# Patient Record
Sex: Female | Born: 1990 | Race: Black or African American | Hispanic: No | Marital: Single | Smoking: Current some day smoker
Health system: Southern US, Community
[De-identification: ages and names within clinical notes are randomized; demographics above are authoritative.]

## PROBLEM LIST (undated history)

## (undated) DIAGNOSIS — F419 Anxiety disorder, unspecified: Secondary | ICD-10-CM

## (undated) HISTORY — PX: WISDOM TOOTH EXTRACTION: SHX21

## (undated) HISTORY — PX: DENTAL SURGERY: SHX609

---

## 2011-04-03 ENCOUNTER — Emergency Department (HOSPITAL_COMMUNITY)
Admission: EM | Admit: 2011-04-03 | Discharge: 2011-04-03 | Disposition: A | Discharge status: Home / Self Care | Payer: PRIVATE HEALTH INSURANCE | Attending: Emergency Medicine | Admitting: Emergency Medicine

## 2011-04-03 ENCOUNTER — Encounter: Payer: Self-pay | Admitting: Emergency Medicine

## 2011-04-03 DIAGNOSIS — A499 Bacterial infection, unspecified: Secondary | ICD-10-CM | POA: Insufficient documentation

## 2011-04-03 DIAGNOSIS — N760 Acute vaginitis: Secondary | ICD-10-CM | POA: Insufficient documentation

## 2011-04-03 DIAGNOSIS — B9689 Other specified bacterial agents as the cause of diseases classified elsewhere: Secondary | ICD-10-CM | POA: Insufficient documentation

## 2011-04-03 DIAGNOSIS — N949 Unspecified condition associated with female genital organs and menstrual cycle: Secondary | ICD-10-CM | POA: Insufficient documentation

## 2011-04-03 HISTORY — DX: Anxiety disorder, unspecified: F41.9

## 2011-04-03 LAB — URINALYSIS, ROUTINE W REFLEX MICROSCOPIC
Bilirubin Urine: NEGATIVE
Nitrite: NEGATIVE
Specific Gravity, Urine: 1.024 (ref 1.005–1.030)
Urobilinogen, UA: 1 mg/dL (ref 0.0–1.0)

## 2011-04-03 LAB — POCT PREGNANCY, URINE: Preg Test, Ur: NEGATIVE

## 2011-04-03 LAB — URINE MICROSCOPIC-ADD ON

## 2011-04-03 LAB — WET PREP, GENITAL

## 2011-04-03 MED ORDER — HYDROCODONE-ACETAMINOPHEN 5-325 MG PO TABS: 1.0000 | ORAL_TABLET | ORAL | Status: AC | PRN

## 2011-04-03 MED ORDER — METRONIDAZOLE 500 MG PO TABS: 500.0000 mg | ORAL_TABLET | Freq: Two times a day (BID) | ORAL | Status: AC

## 2011-04-03 NOTE — ED Notes (Signed)
Rx given x2 D/c instructions reviewed w/ pt and friend - pt and friend deny any further questions or concerns at present.  

## 2011-04-03 NOTE — ED Notes (Signed)
PT. REPORTS VAGINAL DISCHARGE ONSET LAST Saturday ,  PRESCRIBED WITH METRONIDAZOLE AND DIFLUCAN WITH NO RELIEF.

## 2011-04-03 NOTE — ED Provider Notes (Signed)
History     CSN: [id number] Arrival date & time: 04/03/2011 12:09 AM   First MD Initiated Contact with Patient 04/03/11 0057      Chief Complaint  Patient presents with  . Vaginal Discharge    (Consider location/radiation/quality/duration/timing/severity/associated sxs/prior treatment) The history is provided by the patient.   patient reports development of vaginal discharge approximately 5 days ago and was prescribed Diflucan as well as metronidazole cream without relief.  She reports ongoing discharge and vaginal discomfort.  She denies fever.  She denies abdominal pain.  She denies nausea and vomiting.  She denies chills.  She denies diarrhea.  No urinary frequency or urinary hesitancy or dysuria.. she reports the discomfort is moderate.  It is constant in nature.  Nothing improves her symptoms.  Nothing worsens her symptoms.  Past Medical History  Diagnosis Date  . Anxiety disorder     Past Surgical History  Procedure Date  . Wisdom tooth extraction     No family history on file.  History  Substance Use Topics  . Smoking status: Never Smoker   . Smokeless tobacco: Not on file  . Alcohol Use: Yes     OCCASIONAL    OB History    Grav Para Term Preterm Abortions TAB SAB Ect Mult Living                  Review of Systems  All other systems reviewed and are negative.    Allergies  Review of patient's allergies indicates no known allergies.  Home Medications   Current Outpatient Rx  Name Route Sig Dispense Refill  . FLUCONAZOLE 150 MG PO TABS Oral Take 150 mg by mouth once.      Marland Kitchen LORAZEPAM 0.5 MG PO TABS Oral Take 0.5 mg by mouth every 6 (six) hours as needed. For anxiety      . METRONIDAZOLE 0.75 % EX CREA Topical Apply 1 application topically 2 (two) times daily.      Marland Kitchen PROMETHAZINE-CODEINE 6.25-10 MG/5ML PO SYRP Oral Take 5 mLs by mouth every 4 (four) hours as needed. For cough and congestion       BP 112/62  Pulse 71  Temp(Src) 97.1 F (36.2 C)  (Oral)  Resp 16  SpO2 100%  LMP 03/09/2011  Physical Exam  Constitutional: She is oriented to person, place, and time. She appears well-developed and well-nourished.  HENT:  Head: Normocephalic.  Eyes: EOM are normal.  Neck: Normal range of motion.  Pulmonary/Chest: Effort normal.  Abdominal: Soft. She exhibits no distension. There is no tenderness.  Genitourinary:       Normal external genitalia.  Small amount of vaginal discharge was creamy in nature.  No cervical erosions or cervical motion tenderness.  No adnexal mass or tenderness.  No adnexal fullness.  No herpetic lesions noted.  Patient did have notable discomfort with insertion of the speculum.  Musculoskeletal: Normal range of motion.  Neurological: She is alert and oriented to person, place, and time.  Psychiatric: She has a normal mood and affect.    ED Course  Procedures (including critical care time)  Labs Reviewed  URINALYSIS, ROUTINE W REFLEX MICROSCOPIC - Abnormal; Notable for the following:    Appearance CLOUDY (*)    Hgb urine dipstick TRACE (*)    Leukocytes, UA LARGE (*)    All other components within normal limits  WET PREP, GENITAL - Abnormal; Notable for the following:    Clue Cells, Wet Prep FEW (*)    WBC,  Wet Prep HPF POC FEW (*)    All other components within normal limits  URINE MICROSCOPIC-ADD ON - Abnormal; Notable for the following:    Squamous Epithelial / LPF FEW (*)    Bacteria, UA MANY (*)    All other components within normal limits  POCT PREGNANCY, URINE  POCT PREGNANCY, URINE  GC/CHLAMYDIA PROBE AMP, GENITAL   No results found.   1. Bacterial vaginitis       MDM  Given complaints of vaginal discharge will cover with oral metronidazole. Dc home.   Suspect vaginitis.  No urinary symptoms.  Urine culture sent.     Lyanne Co, MD 04/03/11 [phone]

## 2011-04-04 LAB — GC/CHLAMYDIA PROBE AMP, GENITAL: GC Probe Amp, Genital: NEGATIVE

## 2012-08-22 ENCOUNTER — Encounter (HOSPITAL_COMMUNITY): Payer: Self-pay | Admitting: Family Medicine

## 2012-08-22 ENCOUNTER — Emergency Department (HOSPITAL_COMMUNITY): Payer: BC Managed Care – PPO

## 2012-08-22 ENCOUNTER — Emergency Department (HOSPITAL_COMMUNITY)
Admission: EM | Admit: 2012-08-22 | Discharge: 2012-08-23 | Disposition: A | Discharge status: Home / Self Care | Payer: BC Managed Care – PPO | Attending: Emergency Medicine | Admitting: Emergency Medicine

## 2012-08-22 DIAGNOSIS — Z79899 Other long term (current) drug therapy: Secondary | ICD-10-CM | POA: Insufficient documentation

## 2012-08-22 DIAGNOSIS — F17200 Nicotine dependence, unspecified, uncomplicated: Secondary | ICD-10-CM | POA: Insufficient documentation

## 2012-08-22 DIAGNOSIS — R0602 Shortness of breath: Secondary | ICD-10-CM | POA: Insufficient documentation

## 2012-08-22 DIAGNOSIS — R0789 Other chest pain: Secondary | ICD-10-CM | POA: Insufficient documentation

## 2012-08-22 DIAGNOSIS — J40 Bronchitis, not specified as acute or chronic: Secondary | ICD-10-CM

## 2012-08-22 DIAGNOSIS — F411 Generalized anxiety disorder: Secondary | ICD-10-CM | POA: Insufficient documentation

## 2012-08-22 MED ORDER — AEROCHAMBER Z-STAT PLUS/MEDIUM MISC
1.0000 | Freq: Once | Status: AC
  Administered 2012-08-22

## 2012-08-22 MED ORDER — ALBUTEROL SULFATE HFA 108 (90 BASE) MCG/ACT IN AERS
2.0000 | INHALATION_SPRAY | RESPIRATORY_TRACT | Status: DC | PRN
  Administered 2012-08-22: 2 via RESPIRATORY_TRACT
  Filled 2012-08-22: qty 6.7

## 2012-08-22 NOTE — ED Notes (Addendum)
Patient states that she has had a cough for the past 2 days. Reports feeling tired and has shortness of breath. Has been coughing up green sputum tinged with blood. Also c/o sore throat and pain in chest from coughing.

## 2012-08-22 NOTE — ED Provider Notes (Signed)
History    This chart was scribed for non-physician practitioner Renne Crigler PA-C working with Richardean Canal, MD by Smitty Pluck, ED scribe. This patient was seen in room WTR9/WTR9 and the patient's care was started at 11:30 PM.   CSN: [id number]  Arrival date & time 08/22/12  2228      Chief Complaint  Patient presents with  . Cough    The history is provided by the patient. No language interpreter was used.   Nancy Rocha is a 22 y.o. female who presents to the Emergency Department complaining of intermittent, moderate,  productive cough onset 2 days ago. She reports that she has green sputum with clots of blood. She reports that she has associated upper sternal chest pain. She reports that she had sore throat but she thinks it is due to coughing. She reports that she had tried hot tea without relief. Pt denies fever, chills, nausea, vomiting, diarrhea, weakness, SOB and any other pain. She denies using birth control, hormone therapy and sitting for long periods of time. She denies recent travel or mobilizations. She denies recent surgeries. No history of DVT or blood clots. She reports that she smokes cigars daily. No lower extremity swelling.    Past Medical History  Diagnosis Date  . Anxiety disorder     Past Surgical History  Procedure Laterality Date  . Wisdom tooth extraction    . Dental surgery      No family history on file.  History  Substance Use Topics  . Smoking status: Current Every Day Smoker -- 0.25 packs/day    Types: Cigarettes  . Smokeless tobacco: Not on file  . Alcohol Use: Yes     Comment: OCCASIONAL    OB History   Grav Para Term Preterm Abortions TAB SAB Ect Mult Living                  Review of Systems  Constitutional: Negative for fever and chills.  HENT: Positive for sore throat. Negative for rhinorrhea.   Eyes: Negative for redness.  Respiratory: Positive for cough. Negative for shortness of breath.   Cardiovascular: Positive for  chest pain.  Gastrointestinal: Negative for nausea, vomiting, abdominal pain and diarrhea.  Genitourinary: Negative for dysuria.  Musculoskeletal: Negative for myalgias.  Skin: Negative for rash.  Neurological: Negative for weakness and headaches.    Allergies  Review of patient's allergies indicates no known allergies.  Home Medications   Current Outpatient Rx  Name  Route  Sig  Dispense  Refill  . fluconazole (DIFLUCAN) 150 MG tablet   Oral   Take 150 mg by mouth once.           Marland Kitchen LORazepam (ATIVAN) 0.5 MG tablet   Oral   Take 0.5 mg by mouth every 6 (six) hours as needed. For anxiety           . metroNIDAZOLE (METROCREAM) 0.75 % cream   Topical   Apply 1 application topically 2 (two) times daily.           . promethazine-codeine (PHENERGAN WITH CODEINE) 6.25-10 MG/5ML syrup   Oral   Take 5 mLs by mouth every 4 (four) hours as needed. For cough and congestion            BP 120/66  Pulse 63  Temp(Src) 98.4 F (36.9 C) (Oral)  Resp 18  SpO2 99%  LMP 07/25/2012  Physical Exam  Nursing note and vitals reviewed. Constitutional: She appears well-developed and well-nourished.  No distress.  HENT:  Head: Normocephalic and atraumatic.  Eyes: EOM are normal.  Neck: Neck supple. No tracheal deviation present.  Cardiovascular: Normal rate, regular rhythm and normal heart sounds.   Pulmonary/Chest: Effort normal and breath sounds normal. No respiratory distress. She has no wheezes. She has no rales.  Musculoskeletal: Normal range of motion.  Neurological: She is alert.  Skin: Skin is warm and dry.  Psychiatric: She has a normal mood and affect. Her behavior is normal.    ED Course  Procedures (including critical care time) DIAGNOSTIC STUDIES: Oxygen Saturation is 99% on room air, normal by my interpretation.    COORDINATION OF CARE: 11:36 PM Discussed ED treatment with pt and pt agrees.   Filed Vitals:   08/22/12 2256  BP: 120/66  Pulse: 63  Temp: 98.4  F (36.9 C)  TempSrc: Oral  Resp: 18  SpO2: 99%      Labs Reviewed - No data to display Dg Chest 2 View (if Patient Has Fever And/or Copd)  08/22/2012  *RADIOLOGY REPORT*  Clinical Data: Productive cough for 1 week; hemoptysis.  CHEST - 2 VIEW  Comparison: None.  Findings: The lungs are well-aerated and clear.  There is no evidence of focal opacification, pleural effusion or pneumothorax.  The heart is normal in size; the mediastinal contour is within normal limits.  No acute osseous abnormalities are seen.  IMPRESSION: No acute cardiopulmonary process seen.   Original Report Authenticated By: Tonia Ghent, M.D.      1. Bronchitis    Patient seen and examined. X-ray reviewed by myself.  Vital signs reviewed and are as follows: Filed Vitals:   08/22/12 2256  BP: 120/66  Pulse: 63  Temp: 98.4 F (36.9 C)  Resp: 18   Patient informed of results. Will treat as bronchitis.  Patient counseled on use of albuterol HFA.  Told to use 1-2 puffs q 4 hours as needed for SOB.  Patient urged to return with worsening shortness of breath, trouble breathing, fever, or other concerns. Patient verbalizes understanding and agrees with plan.    MDM  Patient with cough. She has noticed a small amount of blood in her sputum. Patient otherwise has no risk factors for pulmonary embolism. No history of blood clots or DVT. No estrogen use. No recent surgeries or immobilizations. She does not have shortness of breath. She is not tachycardic and has a normal oxygen saturation. I feel the patient's symptoms are due to bronchitis. Chest x-ray is negative. Patient appears well, nontoxic. Will treat conservatively. Appropriate return instructions given.      I personally performed the services described in this documentation, which was scribed in my presence. The recorded information has been reviewed and is accurate.    Renne Crigler, PA-C 08/23/12 [phone]

## 2012-08-25 NOTE — ED Provider Notes (Signed)
Medical screening examination/treatment/procedure(s) were performed by non-physician practitioner and as supervising physician I was immediately available for consultation/collaboration.   Richardean Canal, MD 08/25/12 [phone]

## 2013-03-26 ENCOUNTER — Encounter (HOSPITAL_COMMUNITY): Payer: Self-pay | Admitting: Emergency Medicine

## 2013-03-26 ENCOUNTER — Emergency Department (HOSPITAL_COMMUNITY)
Admission: EM | Admit: 2013-03-26 | Discharge: 2013-03-26 | Disposition: A | Discharge status: Home / Self Care | Payer: BC Managed Care – PPO | Attending: Emergency Medicine | Admitting: Emergency Medicine

## 2013-03-26 DIAGNOSIS — Z8659 Personal history of other mental and behavioral disorders: Secondary | ICD-10-CM | POA: Insufficient documentation

## 2013-03-26 DIAGNOSIS — Z792 Long term (current) use of antibiotics: Secondary | ICD-10-CM | POA: Insufficient documentation

## 2013-03-26 DIAGNOSIS — N764 Abscess of vulva: Secondary | ICD-10-CM

## 2013-03-26 DIAGNOSIS — F17200 Nicotine dependence, unspecified, uncomplicated: Secondary | ICD-10-CM | POA: Insufficient documentation

## 2013-03-26 MED ORDER — LIDOCAINE HCL 2 % IJ SOLN
20.0000 mL | Freq: Once | INTRAMUSCULAR | Status: AC
  Administered 2013-03-26: 400 mg via INTRADERMAL

## 2013-03-26 MED ORDER — DOXYCYCLINE HYCLATE 100 MG PO CAPS: 100.0000 mg | ORAL_CAPSULE | Freq: Two times a day (BID) | ORAL | Status: DC

## 2013-03-26 MED ORDER — OXYCODONE-ACETAMINOPHEN 5-325 MG PO TABS: 1.0000 | ORAL_TABLET | ORAL | Status: DC | PRN

## 2013-03-26 MED ORDER — LIDOCAINE HCL 2 % IJ SOLN
INTRAMUSCULAR | Status: AC
  Administered 2013-03-26: 06:00:00
  Filled 2013-03-26: qty 20

## 2013-03-26 MED ORDER — OXYCODONE-ACETAMINOPHEN 5-325 MG PO TABS
1.0000 | ORAL_TABLET | Freq: Once | ORAL | Status: AC
  Administered 2013-03-26: 1 via ORAL
  Filled 2013-03-26: qty 1

## 2013-03-26 MED ORDER — FLUCONAZOLE 150 MG PO TABS: 150.0000 mg | ORAL_TABLET | Freq: Once | ORAL | Status: DC

## 2013-03-26 MED ORDER — CEFTRIAXONE SODIUM 250 MG IJ SOLR
250.0000 mg | Freq: Once | INTRAMUSCULAR | Status: AC
  Administered 2013-03-26: 250 mg via INTRAMUSCULAR
  Filled 2013-03-26: qty 250

## 2013-03-26 MED ORDER — LIDOCAINE HCL 1 % IJ SOLN
INTRAMUSCULAR | Status: AC
  Administered 2013-03-26: 1 mL
  Filled 2013-03-26: qty 20

## 2013-03-26 NOTE — ED Provider Notes (Signed)
CSN: [id number]     Arrival date & time 03/26/13  0045 History   First MD Initiated Contact with Patient 03/26/13 0125     Chief Complaint  Patient presents with  . Vaginal Pain   (Consider location/radiation/quality/duration/timing/severity/associated sxs/prior Treatment) HPI Patient presents with pain to the point area for the past 5 days. She was seen by her [facility] told that she had a Bartholin's abscess but that it was too small to incise. Started on Flagyl. Patient states that since yesterday when she was seen the pain is increased. She denies any fevers or chills. She denies any nausea or vomiting. She denies any vaginal bleeding or discharge. Past Medical History  Diagnosis Date  . Anxiety disorder    Past Surgical History  Procedure Laterality Date  . Wisdom tooth extraction    . Dental surgery     History reviewed. No pertinent family history. History  Substance Use Topics  . Smoking status: Current Every Day Smoker -- 0.25 packs/day    Types: Cigarettes  . Smokeless tobacco: Not on file  . Alcohol Use: Yes     Comment: OCCASIONAL   OB History   Grav Para Term Preterm Abortions TAB SAB Ect Mult Living                 Review of Systems  Constitutional: Negative for fever and chills.  Respiratory: Negative for shortness of breath.   Cardiovascular: Negative for chest pain.  Gastrointestinal: Negative for nausea, vomiting, abdominal pain, diarrhea and constipation.  Genitourinary: Positive for vaginal pain. Negative for vaginal bleeding and vaginal discharge.  All other systems reviewed and are negative.    Allergies  Review of patient's allergies indicates no known allergies.  Home Medications   Current Outpatient Rx  Name  Route  Sig  Dispense  Refill  . HYDROcodone-acetaminophen (NORCO/VICODIN) 5-325 MG per tablet   Oral   Take 1 tablet by mouth every 6 (six) hours as needed for moderate pain (pain).         Marland Kitchen ibuprofen (ADVIL,MOTRIN)  600 MG tablet   Oral   Take 600 mg by mouth every 6 (six) hours as needed (pain).         . metroNIDAZOLE (FLAGYL) 500 MG tablet   Oral   Take 500 mg by mouth 2 (two) times daily.         Marland Kitchen oxyCODONE-acetaminophen (PERCOCET/ROXICET) 5-325 MG per tablet   Oral   Take 1 tablet by mouth every 4 (four) hours as needed for severe pain (pain).         . fluconazole (DIFLUCAN) 150 MG tablet   Oral   Take 150 mg by mouth once.          BP 117/77  Pulse 94  Temp(Src) 98.2 F (36.8 C) (Oral)  Resp 16  SpO2 100%  LMP 02/17/2013 Physical Exam  Nursing note and vitals reviewed. Constitutional: She is oriented to person, place, and time. She appears well-developed and well-nourished. No distress.  HENT:  Head: Normocephalic and atraumatic.  Mouth/Throat: Oropharynx is clear and moist.  Eyes: EOM are normal. Pupils are equal, round, and reactive to light.  Neck: Normal range of motion. Neck supple.  Cardiovascular: Normal rate and regular rhythm.   Pulmonary/Chest: Effort normal and breath sounds normal. No respiratory distress. She has no wheezes. She has no rales.  Abdominal: Soft. Bowel sounds are normal. She exhibits no distension and no mass. There is no tenderness. There is no  rebound and no guarding.  Genitourinary:  Patient has a tender fluctuant mass to the left labia majora with no spontaneous drainage.  Musculoskeletal: Normal range of motion. She exhibits no edema and no tenderness.  Neurological: She is alert and oriented to person, place, and time.  Skin: Skin is warm and dry. No rash noted. No erythema.  Psychiatric: She has a normal mood and affect. Her behavior is normal.    ED Course  INCISION AND DRAINAGE Date/Time: 03/26/2013 4:43 AM Performed by: Loren Racer Authorized by: Ranae Palms, Brianah Hopson Consent: Verbal consent obtained. Patient identity confirmed: verbally with patient Time out: Immediately prior to procedure a "time out" was called to verify the  correct patient, procedure, equipment, support staff and site/side marked as required. Type: abscess Body area: anogenital Location details: Bartholin's gland Anesthesia: local infiltration Local anesthetic: lidocaine 1% without epinephrine Anesthetic total: 3 ml Patient sedated: no Scalpel size: 11 Incision type: single straight Complexity: simple Drainage: purulent Drainage amount: moderate Wound treatment: wound left open Packing material: none Patient tolerance: Patient tolerated the procedure well with no immediate complications. Comments: Wound explored and with no apparent loculations.    (including critical care time) Labs Review Labs Reviewed - No data to display Imaging Review No results found.  EKG Interpretation   None       MDM   Patient started on by mouth antibiotics. States she is feeling much better after incision and drainage. Return precautions given.   Loren Racer, MD 03/28/13 0110

## 2013-03-26 NOTE — ED Notes (Signed)
Pt arrived in the ED with an abscess on her vagina.  Pt seen by [facility] medical and given antibiotics and ibuprofen but pt awoke with the inability to walk due to excessive pain.  Pt told by campus medical that abscess was to small to lance

## 2014-07-27 ENCOUNTER — Encounter (HOSPITAL_COMMUNITY): Payer: Self-pay | Admitting: Emergency Medicine

## 2014-07-27 ENCOUNTER — Emergency Department (HOSPITAL_COMMUNITY)
Admission: EM | Admit: 2014-07-27 | Discharge: 2014-07-27 | Disposition: A | Discharge status: Home / Self Care | Payer: BLUE CROSS/BLUE SHIELD | Attending: Emergency Medicine | Admitting: Emergency Medicine

## 2014-07-27 ENCOUNTER — Emergency Department (HOSPITAL_COMMUNITY): Payer: BLUE CROSS/BLUE SHIELD

## 2014-07-27 DIAGNOSIS — Z792 Long term (current) use of antibiotics: Secondary | ICD-10-CM | POA: Diagnosis not present

## 2014-07-27 DIAGNOSIS — Z720 Tobacco use: Secondary | ICD-10-CM | POA: Insufficient documentation

## 2014-07-27 DIAGNOSIS — Y9389 Activity, other specified: Secondary | ICD-10-CM | POA: Insufficient documentation

## 2014-07-27 DIAGNOSIS — S6991XA Unspecified injury of right wrist, hand and finger(s), initial encounter: Secondary | ICD-10-CM | POA: Diagnosis present

## 2014-07-27 DIAGNOSIS — S199XXA Unspecified injury of neck, initial encounter: Secondary | ICD-10-CM | POA: Insufficient documentation

## 2014-07-27 DIAGNOSIS — Y92410 Unspecified street and highway as the place of occurrence of the external cause: Secondary | ICD-10-CM | POA: Diagnosis not present

## 2014-07-27 DIAGNOSIS — Z793 Long term (current) use of hormonal contraceptives: Secondary | ICD-10-CM | POA: Diagnosis not present

## 2014-07-27 DIAGNOSIS — S4991XA Unspecified injury of right shoulder and upper arm, initial encounter: Secondary | ICD-10-CM | POA: Diagnosis not present

## 2014-07-27 DIAGNOSIS — Y998 Other external cause status: Secondary | ICD-10-CM | POA: Diagnosis not present

## 2014-07-27 DIAGNOSIS — M79641 Pain in right hand: Secondary | ICD-10-CM

## 2014-07-27 DIAGNOSIS — F419 Anxiety disorder, unspecified: Secondary | ICD-10-CM | POA: Insufficient documentation

## 2014-07-27 MED ORDER — ONDANSETRON 4 MG PO TBDP
4.0000 mg | ORAL_TABLET | Freq: Once | ORAL | Status: AC
  Administered 2014-07-27: 4 mg via ORAL
  Filled 2014-07-27: qty 1

## 2014-07-27 MED ORDER — TRAMADOL HCL 50 MG PO TABS: 50.0000 mg | ORAL_TABLET | Freq: Four times a day (QID) | ORAL | Status: DC | PRN

## 2014-07-27 MED ORDER — CYCLOBENZAPRINE HCL 10 MG PO TABS
5.0000 mg | ORAL_TABLET | Freq: Once | ORAL | Status: AC
  Administered 2014-07-27: 5 mg via ORAL
  Filled 2014-07-27: qty 1

## 2014-07-27 MED ORDER — CYCLOBENZAPRINE HCL 5 MG PO TABS: 5.0000 mg | ORAL_TABLET | Freq: Two times a day (BID) | ORAL | Status: DC | PRN

## 2014-07-27 MED ORDER — HYDROCODONE-ACETAMINOPHEN 5-325 MG PO TABS
1.0000 | ORAL_TABLET | Freq: Once | ORAL | Status: AC
  Administered 2014-07-27: 1 via ORAL
  Filled 2014-07-27: qty 1

## 2014-07-27 NOTE — Discharge Instructions (Signed)
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
## 2015-07-04 ENCOUNTER — Encounter (HOSPITAL_COMMUNITY): Payer: Self-pay | Admitting: Emergency Medicine

## 2015-07-04 ENCOUNTER — Emergency Department (HOSPITAL_COMMUNITY)
Admission: EM | Admit: 2015-07-04 | Discharge: 2015-07-04 | Disposition: A | Discharge status: Home / Self Care | Payer: BLUE CROSS/BLUE SHIELD | Attending: Emergency Medicine | Admitting: Emergency Medicine

## 2015-07-04 DIAGNOSIS — R110 Nausea: Secondary | ICD-10-CM | POA: Insufficient documentation

## 2015-07-04 DIAGNOSIS — Z793 Long term (current) use of hormonal contraceptives: Secondary | ICD-10-CM | POA: Diagnosis not present

## 2015-07-04 DIAGNOSIS — F419 Anxiety disorder, unspecified: Secondary | ICD-10-CM | POA: Insufficient documentation

## 2015-07-04 DIAGNOSIS — F17210 Nicotine dependence, cigarettes, uncomplicated: Secondary | ICD-10-CM | POA: Diagnosis not present

## 2015-07-04 DIAGNOSIS — N750 Cyst of Bartholin's gland: Secondary | ICD-10-CM | POA: Diagnosis present

## 2015-07-04 DIAGNOSIS — Z79899 Other long term (current) drug therapy: Secondary | ICD-10-CM | POA: Diagnosis not present

## 2015-07-04 MED ORDER — SULFAMETHOXAZOLE-TRIMETHOPRIM 800-160 MG PO TABS: 1.0000 | ORAL_TABLET | Freq: Two times a day (BID) | ORAL | Status: DC

## 2015-07-04 MED ORDER — SULFAMETHOXAZOLE-TRIMETHOPRIM 800-160 MG PO TABS
1.0000 | ORAL_TABLET | Freq: Once | ORAL | Status: AC
  Administered 2015-07-04: 1 via ORAL
  Filled 2015-07-04: qty 1

## 2015-07-04 MED ORDER — FLUCONAZOLE 200 MG PO TABS: 200.0000 mg | ORAL_TABLET | Freq: Every day | ORAL | Status: AC

## 2015-07-04 NOTE — Discharge Instructions (Signed)
Bartholin Cyst or Abscess A Bartholin cyst is a fluid-filled sac that forms on a Bartholin gland. Bartholin glands are small glands that are located within the folds of skin (labia) along the sides of the lower opening of the vagina. These glands produce a fluid to moisten the outside of the vagina during sexual intercourse. A Bartholin cyst causes a bulge on the side of the vagina. A cyst that is not large or infected may not cause symptoms or problems. However, if the fluid within the cyst becomes infected, the cyst can turn into an abscess. An abscess may cause discomfort or pain. CAUSES A Bartholin cyst may develop when the duct of the gland becomes blocked. In many cases, the cause of this is not known. Various kinds of bacteria can cause the cyst to become infected and develop into an abscess. RISK FACTORS You may be at an increased risk of developing a Bartholin cyst or abscess if:  You are a woman of reproductive age.  You have a history of previous Bartholin cysts or abscesses.  You have diabetes.  You have a sexually transmitted disease (STD). SIGNS AND SYMPTOMS The severity of symptoms varies depending on the size of the cyst and whether it is infected. Symptoms may include:  A bulge or swelling near the lower opening of your vagina.  Discomfort or pain.  Redness.  Pain during sexual intercourse.  Pain when walking.  Fluid draining from the area. DIAGNOSIS Your health care provider may make a diagnosis based on your symptoms and a physical exam. He or she will look for swelling in your vaginal area. Blood tests may be done to check for infections. A sample of fluid from the cyst or abscess may also be taken to be tested in a lab. TREATMENT Small cysts that are not infected may not require any treatment. These often go away on their own. Yourhealth care provider will recommend hot baths and the use of warm compresses. These may also be part of the treatment for an abscess.  Treatment options for a large cyst or abscess may include:   Antibiotic medicine.  A surgical procedure to drain the abscess. One of the following procedures may be done:  Incision and drainage. An incision is made in the cyst or abscess so that the fluid drains out. A catheter may be placed inside the cyst so that it does not close and fill up with fluid again. The catheter will be removed after you have a follow-up visit with a specialist (gynecologist).  Marsupialization. The cyst or abscess is opened and kept open by stitching the edges of the skin to the walls of the cyst or abscess. This allows it to continue to drain and not fill up with fluid again. If you have cysts or abscesses that keep returning and have required incision and drainage multiple times, your health care provider may talk to you about surgery to remove the Bartholin gland. HOME CARE INSTRUCTIONS  Take medicines only as directed by your health care provider.  If you were prescribed an antibiotic medicine, finish it all even if you start to feel better.  Apply warm, wet compresses to the area or take warm, shallow baths that cover your pelvic region (sitz baths) several times a day or as directed by your health care provider.  Do not squeeze the cyst or apply heavy pressure to it.  Do not have sexual intercourse until the cyst has gone away.  If your cyst or abscess was   opened, a small piece of gauze or a drain may have been placed in the area to allow drainage. Do not remove the gauze or the drain until directed by your health care provider.  Wear feminine pads--not tampons--as needed for any drainage or bleeding.  Keep all follow-up visits as directed by your health care provider. This is important. PREVENTION Take these steps to help prevent a Bartholin cyst from returning:  Practice good hygiene.   Clean your vaginal area with mild soap and a soft cloth when you bathe.  Practice safe sex to prevent  STDs. SEEK MEDICAL CARE IF:  You have increased pain, swelling, or redness in the area of the cyst.  Puslike drainage is coming from the cyst.  You have a fever.   This information is not intended to replace advice given to you by your health care provider. Make sure you discuss any questions you have with your health care provider.   Document Released: 05/05/2005 Document Revised: 05/26/2014 Document Reviewed: 12/19/2013 Elsevier Interactive Patient Education 2016 Elsevier Inc.  

## 2015-07-04 NOTE — ED Notes (Signed)
Pt states she has a bartholins cyst  Pt states she has had 3 before this one  Pt states she thinks she has caught it early and may just need antibiotics  Pt states she has scar tissue and that area is swollen

## 2015-07-04 NOTE — ED Provider Notes (Signed)
CSN: [id number]     Arrival date & time 07/04/15  1943 History  By signing my name below, I, Nancy Rocha, attest that this documentation has been prepared under the direction and in the presence of Marlon Pel, PA-C. Electronically Signed: Phillis Rocha, ED Scribe. 07/04/2015. 11:09 PM.   Chief Complaint  Patient presents with  . Bartholin's Cyst   The history is provided by the patient. No language interpreter was used.  HPI Comments: Nancy Rocha is a 25 y.o. female with a hx of Bartholin's Cyst who presents to the Emergency Department complaining of a recurrent cyst to the vaginal region onset 4 days ago. Pt states that she has had similar symptoms 3 times in the past and has had previous I&D of the area. She states that the scar from previous I&Ds is where the swelling is now and will intermittently swell but go away on its own. She has not taken anything for her current symptoms. She denies fever, chills, nausea, or vomiting.  Past Medical History  Diagnosis Date  . Anxiety disorder    Past Surgical History  Procedure Laterality Date  . Wisdom tooth extraction    . Dental surgery     History reviewed. No pertinent family history. Social History  Substance Use Topics  . Smoking status: Current Every Day Smoker -- 0.25 packs/day    Types: Cigarettes  . Smokeless tobacco: None  . Alcohol Use: Yes     Comment: OCCASIONAL   OB History    No data available     Review of Systems  Constitutional: Negative for fever and chills.  Gastrointestinal: Negative for nausea and vomiting.  Skin: Positive for wound.  All other systems reviewed and are negative.  Allergies  Pollen extract  Home Medications   Prior to Admission medications   Medication Sig Start Date End Date Taking? Authorizing Provider  cyclobenzaprine (FLEXERIL) 5 MG tablet Take 1 tablet (5 mg total) by mouth 2 (two) times daily as needed for muscle spasms. 07/27/14   Danen Lapaglia Neva Seat, PA-C  doxycycline  (VIBRAMYCIN) 100 MG capsule Take 1 capsule (100 mg total) by mouth 2 (two) times daily. One po bid x 7 days 03/26/13   Loren Racer, MD  fluconazole (DIFLUCAN) 150 MG tablet Take 1 tablet (150 mg total) by mouth once. 03/26/13   Loren Racer, MD  fluconazole (DIFLUCAN) 200 MG tablet Take 1 tablet (200 mg total) by mouth daily. 07/04/15 07/11/15  Marlon Pel, PA-C  Norgestimate-Ethinyl Estradiol Triphasic 0.18/0.215/0.25 MG-35 MCG tablet Take 1 tablet by mouth daily.    Historical Provider, MD  oxyCODONE-acetaminophen (PERCOCET) 5-325 MG per tablet Take 1 tablet by mouth every 4 (four) hours as needed. 03/26/13   Loren Racer, MD  sulfamethoxazole-trimethoprim (BACTRIM DS,SEPTRA DS) 800-160 MG tablet Take 1 tablet by mouth 2 (two) times daily. 07/04/15 07/11/15  Marlon Pel, PA-C  traMADol (ULTRAM) 50 MG tablet Take 1 tablet (50 mg total) by mouth every 6 (six) hours as needed. 07/27/14   Jahmani Staup Neva Seat, PA-C   BP 133/77 mmHg  Pulse 78  Temp(Src) 98.6 F (37 C) (Oral)  Resp 20  SpO2 100%  LMP 07/03/2015 (Exact Date) Physical Exam  Constitutional: She is oriented to person, place, and time. She appears well-developed and well-nourished. No distress.  HENT:  Head: Normocephalic and atraumatic.  Mouth/Throat: Oropharynx is clear and moist. No oropharyngeal exudate.  Eyes: Conjunctivae and EOM are normal. Pupils are equal, round, and reactive to light.  Neck: Normal range of motion. Neck  supple.  Genitourinary:     Musculoskeletal: Normal range of motion.  Neurological: She is alert and oriented to person, place, and time.  Skin: Skin is warm and dry.  Psychiatric: She has a normal mood and affect. Her behavior is normal.    ED Course  Procedures (including critical care time) DIAGNOSTIC STUDIES: Oxygen Saturation is 100% on RA, normal by my interpretation.    COORDINATION OF CARE: 11:03 PM-Discussed treatment plan which includes Bactrim, Diflucan and follow up with [facility]  hospital with pt at bedside and pt agreed to plan. Pt declines I&D at this time.   Labs Review Labs Reviewed - No data to display  Imaging Review No results found. I have personally reviewed and evaluated these images and lab results as part of my medical decision-making.   EKG Interpretation None      MDM   Final diagnoses:  Bartholin cyst   Pt declines I&D at this time. She states that she has researched Bartholin's Cyst and found that the more I&Ds a person receives, the more likely the cyst will return. She would prefer to take the anti-biotic and follow up at [facility] hospital for further treatment. Pt reports associated nausea with taking antibiotics, so she will be discharged with Diflucan. Pt agrees to plan.  Discussed return precautions in depth.  Filed Vitals:   07/04/15 1956 07/04/15 2226  BP: 140/89 133/77  Pulse: 84 78  Temp: 98.4 F (36.9 C) 98.6 F (37 C)  Resp: 18 20      I personally performed the services described in this documentation, which was scribed in my presence. The recorded information has been reviewed and is accurate.   Marlon Pel, PA-C 07/04/15 2320  Azalia Bilis, MD 07/05/15 [phone]

## 2015-07-09 ENCOUNTER — Encounter: Payer: Self-pay | Admitting: Family Medicine

## 2015-07-09 ENCOUNTER — Ambulatory Visit (INDEPENDENT_AMBULATORY_CARE_PROVIDER_SITE_OTHER): Payer: BLUE CROSS/BLUE SHIELD | Admitting: Family Medicine

## 2015-07-09 VITALS — BP 124/73 | HR 100 | Temp 98.4°F | Ht 62.0 in | Wt 152.0 lb

## 2015-07-09 DIAGNOSIS — F419 Anxiety disorder, unspecified: Secondary | ICD-10-CM | POA: Insufficient documentation

## 2015-07-09 DIAGNOSIS — N750 Cyst of Bartholin's gland: Secondary | ICD-10-CM | POA: Diagnosis not present

## 2015-07-09 NOTE — Progress Notes (Signed)
Patient ID: Nancy Rocha, female   DOB: [birth year], 25 y.o.   MRN: [id number]   CLINIC ENCOUNTER NOTE  History:  25 y.o. No obstetric history on file. here today for bartholin cyst. She denies any abnormal vaginal discharge, bleeding, pelvic pain or other concerns.   Bartholin Cyst: This is recurrent problem. Reports 4 other occurences. She has had I&D in the ED before but never drain placement. She was most recently seen on 2/15 and declined drainage and was put on abx. She denies fevers, chills, Nausea, vomiting. She reports the pain is overall improving since being seen and reports it "it draining"  Past Medical History  Diagnosis Date  . Anxiety disorder     Past Surgical History  Procedure Laterality Date  . Wisdom tooth extraction    . Dental surgery     The following portions of the patient's history were reviewed and updated as appropriate: allergies, current medications, past family history, past medical history, past social history, past surgical history and problem list.    Review of Systems:  Pertinent items noted in HPI and remainder of comprehensive ROS otherwise negative.  Objective:  Physical Exam BP 124/73 mmHg  Pulse 100  Temp(Src) 98.4 F (36.9 C) (Oral)  Ht  (1.575 m)  Wt 152 lb (68.947 kg)  BMI 27.79 kg/m2  LMP 07/03/2015 (Exact Date) Gen: well appearing CV: RR Lung: normal WOB GU: Linear with superior 1 cm collection and then inferior 2-3 cm fluctuant area on the left labia extending into the vagina. These areas are communicating.TTP but no overlying erythema. Not able to express fluid/pus. Dr Debroah Loop brought into exam to see this area.   Labs and Imaging No results found.  Assessment & Plan:   1. Bartholin gland cyst Patient declined drain placement here in the clinic. She was offered an appt next day with premedication with ativan and percocet. She asked about marsupialization as well as laser treatment. It was discussed that to date she has  not received the standard treatment and this should be tried prior to surgical management  Reviewed return precautions with patient. She will return to [facility]U if this acutely worsens or is not improving  Recommended continue abx until completed course and frequent warm compresses/baths.   Follow up PRN  Total face-to-face time with patient: 30 minutes. Over 50% of encounter was spent on counseling and coordination of care.

## 2015-07-11 ENCOUNTER — Encounter (HOSPITAL_COMMUNITY): Payer: Self-pay

## 2015-07-11 ENCOUNTER — Inpatient Hospital Stay (HOSPITAL_COMMUNITY)
Admission: AD | Admit: 2015-07-11 | Discharge: 2015-07-11 | Disposition: A | Discharge status: Home / Self Care | Payer: BLUE CROSS/BLUE SHIELD | Source: Ambulatory Visit | Attending: Family Medicine | Admitting: Family Medicine

## 2015-07-11 ENCOUNTER — Inpatient Hospital Stay (HOSPITAL_COMMUNITY)
Admission: AD | Admit: 2015-07-11 | Discharge: 2015-07-11 | Disposition: A | Discharge status: Home / Self Care | Payer: BLUE CROSS/BLUE SHIELD | Source: Ambulatory Visit | Attending: Obstetrics and Gynecology | Admitting: Obstetrics and Gynecology

## 2015-07-11 DIAGNOSIS — N751 Abscess of Bartholin's gland: Secondary | ICD-10-CM

## 2015-07-11 DIAGNOSIS — Z9109 Other allergy status, other than to drugs and biological substances: Secondary | ICD-10-CM | POA: Diagnosis not present

## 2015-07-11 DIAGNOSIS — F17210 Nicotine dependence, cigarettes, uncomplicated: Secondary | ICD-10-CM | POA: Diagnosis not present

## 2015-07-11 HISTORY — DX: Anxiety disorder, unspecified: F41.9

## 2015-07-11 MED ORDER — OXYCODONE-ACETAMINOPHEN 5-325 MG PO TABS
2.0000 | ORAL_TABLET | Freq: Once | ORAL | Status: AC
  Administered 2015-07-11: 2 via ORAL
  Filled 2015-07-11: qty 2

## 2015-07-11 MED ORDER — OXYCODONE-ACETAMINOPHEN 5-325 MG PO TABS: 1.0000 | ORAL_TABLET | Freq: Four times a day (QID) | ORAL | Status: DC | PRN

## 2015-07-11 MED ORDER — LORAZEPAM 1 MG PO TABS
0.5000 mg | ORAL_TABLET | Freq: Once | ORAL | Status: AC
  Administered 2015-07-11: 0.5 mg via ORAL
  Filled 2015-07-11: qty 1

## 2015-07-11 MED ORDER — DOXYCYCLINE HYCLATE 100 MG PO CAPS: 100.0000 mg | ORAL_CAPSULE | Freq: Two times a day (BID) | ORAL | Status: DC

## 2015-07-11 MED ORDER — LIDOCAINE HCL (PF) 2 % IJ SOLN
0.0000 mL | Freq: Once | INTRAMUSCULAR | Status: AC | PRN
  Administered 2015-07-11: 20 mL via INTRADERMAL
  Filled 2015-07-11: qty 20

## 2015-07-11 NOTE — MAU Provider Note (Signed)
History     CSN: [id number]  Arrival date and time: 07/11/15 1914   First Provider Initiated Contact with Patient 07/11/15 0044      No chief complaint on file.  HPI Ms. Nancy Rocha is a 25 y.o. female who presents to MAU today with complaint of bartholin's gland abscess. The patient was seen in the [facility] on 07/09/15 and offered drainage with Ward catheter placement. The patient felt that the area was draining spontaneously at the time, although the provider was unable to express drainage. She has been on antibiotics since 07/04/15 when she was first seen at [facility] and declined drainage. She states that she has had 3 previous Bartholin's gland abscesses previously. She states that the area is no longer draining and she feels it is larger and more painful. She is not taking anything for pain. She is still taking Bactrim and performing sitz baths and warm compresses x 1 week. She denies fever.   OB History    Gravida Para Term Preterm AB TAB SAB Ectopic Multiple Living        Past Medical History  Diagnosis Date  . Anxiety disorder   . Anxiety     Past Surgical History  Procedure Laterality Date  . Wisdom tooth extraction    . Dental surgery      History reviewed. No pertinent family history.  Social History  Substance Use Topics  . Smoking status: Current Some Day Smoker -- 0.25 packs/day    Types: Cigarettes  . Smokeless tobacco: None  . Alcohol Use: 0.0 oz/week    0 Standard drinks or equivalent per week     Comment: OCCASIONAL    Allergies:  Allergies  Allergen Reactions  . Pollen Extract     Prescriptions prior to admission  Medication Sig Dispense Refill Last Dose  . Ascorbic Acid (VITAMIN C) 1000 MG tablet Take 1,000 mg by mouth as needed.   07/10/2015 at Unknown time  . fluconazole (DIFLUCAN) 200 MG tablet Take 1 tablet (200 mg total) by mouth daily. 3 tablet 0 Past Week at Unknown time  . Homeopathic Products (AZO YEAST PLUS) TABS Take 1  tablet by mouth daily.   07/10/2015 at Unknown time  . Multiple Vitamins-Minerals (MULTIVITAMIN & MINERAL PO) Take 1 tablet by mouth daily.   07/10/2015 at Unknown time  . sulfamethoxazole-trimethoprim (BACTRIM DS,SEPTRA DS) 800-160 MG tablet Take 1 tablet by mouth 2 (two) times daily. 14 tablet 0 07/10/2015 at Unknown time    Review of Systems  Constitutional: Negative for fever and malaise/fatigue.  Gastrointestinal: Negative for abdominal pain.  Genitourinary:       Neg -vaginal bleeding, discharge   Physical Exam   Blood pressure 122/65, pulse 74, temperature 98.3 F (36.8 C), temperature source Oral, resp. rate 16, last menstrual period 07/03/2015, SpO2 100 %.  Physical Exam  Nursing note and vitals reviewed. Constitutional: She is oriented to person, place, and time. She appears well-developed and well-nourished. No distress.  HENT:  Head: Normocephalic and atraumatic.  Cardiovascular: Normal rate.   Respiratory: Effort normal.  GI: Soft. She exhibits no distension.  Genitourinary:     Neurological: She is alert and oriented to person, place, and time.  Skin: Skin is warm and dry. No erythema.  Psychiatric: She has a normal mood and affect.    MAU Course  Procedures Bartholin Cyst I&D and Word Catheter Placement Enlarged abscess palpated in front of the hymenal ring  around 5 o' clock.  Written informed consent was obtained.  Discussed complications and possible outcomes of procedure including recurrence of cyst, scarring leading to infecton, bleeding, dyspareunia, distortion of anatomy.  Patient was examined in the dorsal lithotomy position and mass was identified.  The area was prepped with Iodine and draped in a sterile manner. 1% Lidocaine (3 ml) was then used to infiltrate area on top of the cyst, behind the hymenal ring.  A 7 mm incision was made using a sterile scapel. Upon palpation of the mass, a moderate amount of bloody purulent drainage was expressed through the  incision. A hemostat was used to break up loculations, which resulted in expression of more bloody purulent drainage.  A Word catheter was placed. 1.5 ml of sterile water was used to inflate the catheter balloon.  The end of the catheter was tucked into the vagina.  Patient tolerated the procedure well, reported feeling " a lot better." - Doxycycline bid x 7 days for treatment - Recommended Sitz baths bid and Motrin and Percocet was given  prn pain.   She was told to call to be examined if she experiences increasing swelling, pain, vaginal discharge, or fever.  - She was instructed to wear a peripad to absorb discharge, and to maintain pelvic rest while the Word catheter is in place.  -The catheter will be left in place for at least two to four weeks to promote formation of an epithelialized tract for permanent drainage of glandular secretions.  - She will need an appointment in [facility] Clinic[facility] 2-4 weeks from now for removal of word catheter.   MDM Discussed options for management at length with the patient. She has decided to go ahead with I&D at this time.  Premedicated with 0.5 mg Ativan and 2 Percocet  Assessment and Plan  A: Bartholin's Gland Abscess  P: Discharge home Rx for Doxycycline and Percocet given to patient Warning signs for worsening condition discussed Patient advised to follow-up with [facility] in 2 weeks for follow-up and removal of Ward catheter. They will call with an appointment.  Patient may return to MAU as needed or if her condition were to change or worsen   Marny Lowenstein, PA-C  07/11/2015, 3:04 AM

## 2015-07-11 NOTE — Discharge Instructions (Signed)
Bartholin Cyst or Abscess A Bartholin cyst is a fluid-filled sac that forms on a Bartholin gland. Bartholin glands are small glands that are found in the folds of skin (labia) on the sides of the lower opening of the vagina. This type of cyst causes a bulge on the side of the vagina. A cyst that is not large or infected may not cause problems. However, if the fluid in the cyst becomes infected, the cyst can turn into an abscess. An abscess may cause discomfort or pain. HOME CARE  Take medicines only as told by your doctor.  If you were prescribed an antibiotic medicine, finish all of it even if you start to feel better.  Apply warm, wet compresses to the area or take warm, shallow baths that cover your pelvic area (sitz baths). Do this many times each day or as told by your doctor.  Do not squeeze the cyst. Do not apply heavy pressure to it.  Do not have sex until the cyst has gone away.  If your cyst or abscess was opened by your doctor, a small piece of gauze or a drain may have been placed in the area. That lets the cyst drain. Do not remove the gauze or the drain until your doctor tells you it is okay to do that.  Do not wear tampons. Wear feminine pads as needed for any fluid or blood.  Keep all follow-up visits as told by your doctor. This is important. GET HELP IF:  Your pain, puffiness (swelling), or redness in the area of the cyst gets worse.  You have fluid or pus pus coming from the cyst.  You have a fever.   This information is not intended to replace advice given to you by your health care provider. Make sure you discuss any questions you have with your health care provider.   Document Released: 08/01/2008 Document Revised: 09/19/2014 Document Reviewed: 12/19/2013 Elsevier Interactive Patient Education 2016 Elsevier Inc.  Incision and Drainage Incision and drainage is a procedure in which a sac-like structure (cystic structure) is opened and drained. The area to be  drained usually contains material such as pus, fluid, or blood.  LET YOUR CAREGIVER KNOW ABOUT:   Allergies to medicine.  Medicines taken, including vitamins, herbs, eyedrops, over-the-counter medicines, and creams.  Use of steroids (by mouth or creams).  Previous problems with anesthetics or numbing medicines.  History of bleeding problems or blood clots.  Previous surgery.  Other health problems, including diabetes and kidney problems.  Possibility of pregnancy, if this applies. RISKS AND COMPLICATIONS  Pain.  Bleeding.  Scarring.  Infection. BEFORE THE PROCEDURE  You may need to have an ultrasound or other imaging tests to see how large or deep your cystic structure is. Blood tests may also be used to determine if you have an infection or how severe the infection is. You may need to have a tetanus shot. PROCEDURE  The affected area is cleaned with a cleaning fluid. The cyst area will then be numbed with a medicine (local anesthetic). A small incision will be made in the cystic structure. A syringe or catheter may be used to drain the contents of the cystic structure, or the contents may be squeezed out. The area will then be flushed with a cleansing solution. After cleansing the area, it is often gently packed with a gauze or another wound dressing. Once it is packed, it will be covered with gauze and tape or some other type of wound dressing.  AFTER THE PROCEDURE   Often, you will be allowed to go home right after the procedure.  You may be given antibiotic medicine to prevent or heal an infection.  If the area was packed with gauze or some other wound dressing, you will likely need to come back in 1 to 2 days to get it removed.  The area should heal in about 14 days.   This information is not intended to replace advice given to you by your health care provider. Make sure you discuss any questions you have with your health care provider.   Document Released: 10/29/2000  Document Revised: 11/04/2011 Document Reviewed: 06/30/2011 Elsevier Interactive Patient Education [employer]

## 2015-07-11 NOTE — MAU Note (Signed)
Pt reports she has a Bartholin cyst and it has stopped draining is hurting really bad again.

## 2015-07-11 NOTE — MAU Note (Signed)
Informed consent signed by pt to perform I&D of Bartholin's cyst. Time out performed.

## 2015-07-11 NOTE — MAU Note (Signed)
Pt presents to MAU stating that her cath fell out where she had a bartholin's cyst drained early this morning, Sharen Counter CNM in with pt discussing plan of care with pt

## 2015-07-16 ENCOUNTER — Encounter: Payer: Self-pay | Admitting: Obstetrics & Gynecology

## 2015-07-16 ENCOUNTER — Ambulatory Visit (INDEPENDENT_AMBULATORY_CARE_PROVIDER_SITE_OTHER): Payer: BLUE CROSS/BLUE SHIELD | Admitting: Obstetrics & Gynecology

## 2015-07-16 VITALS — BP 122/64 | HR 77 | Temp 98.5°F | Ht 62.0 in | Wt 151.3 lb

## 2015-07-16 DIAGNOSIS — N750 Cyst of Bartholin's gland: Secondary | ICD-10-CM | POA: Diagnosis not present

## 2015-07-16 NOTE — Progress Notes (Signed)
Stopped taking doxycycline 07/13/15 because made her nauseated. States ward catheter fell out same day 6 hours later.

## 2015-07-16 NOTE — Progress Notes (Signed)
Patient ID: Nancy Rocha, female   DOB: Aug 25, 1990, 25 y.o.   MRN: [id number]  Chief Complaint  Patient presents with  . Follow-up    Bartholin's Gland Abscess   S/p I&D 07/09/15 in [facility] HPI Nancy Rocha is a 25 y.o. female.  [id number] Patient's last menstrual period was 07/03/2015 (exact date). Presented with increased pain from Bartholin's gland abscess on 2/20 Word catheter was placed but it was expelled hours later. Much less pain now.  HPI  Past Medical History  Diagnosis Date  . Anxiety disorder   . Anxiety     Past Surgical History  Procedure Laterality Date  . Wisdom tooth extraction    . Dental surgery      History reviewed. No pertinent family history.  Social History Social History  Substance Use Topics  . Smoking status: Current Some Day Smoker -- 0.25 packs/day  . Smokeless tobacco: Never Used     Comment: smokes hookah  . Alcohol Use: 0.0 oz/week    0 Standard drinks or equivalent per week     Comment: OCCASIONAL    Allergies  Allergen Reactions  . Pollen Extract     Current Outpatient Prescriptions  Medication Sig Dispense Refill  . Ascorbic Acid (VITAMIN C) 1000 MG tablet Take 1,000 mg by mouth as needed.    . Homeopathic Products (AZO YEAST PLUS) TABS Take 1 tablet by mouth daily.    . Multiple Vitamins-Minerals (MULTIVITAMIN & MINERAL PO) Take 1 tablet by mouth daily.    Marland Kitchen doxycycline (VIBRAMYCIN) 100 MG capsule Take 1 capsule (100 mg total) by mouth 2 (two) times daily. (Patient not taking: Reported on 07/16/2015) 28 capsule 0  . LORazepam (ATIVAN) 0.5 MG tablet Take by mouth. Reported on 07/16/2015    . Norgestimate-Ethinyl Estradiol Triphasic (ORTHO TRI-CYCLEN LO) 0.18/0.215/0.25 MG-25 MCG tab Take by mouth. Reported on 07/16/2015    . oxyCODONE-acetaminophen (PERCOCET/ROXICET) 5-325 MG tablet Take 1 tablet by mouth every 6 (six) hours as needed for severe pain. (Patient not taking: Reported on 07/16/2015) 12 tablet 0   No current  facility-administered medications for this visit.    Review of Systems Review of Systems  Gastrointestinal: Positive for nausea (resolved after stopping doxycycline).  Genitourinary: Positive for vaginal discharge and vaginal pain (mild). Negative for pelvic pain.    Blood pressure 122/64, pulse 77, temperature 98.5 F (36.9 C), height  (1.575 m), weight 151 lb 4.8 oz (68.629 kg), last menstrual period 07/03/2015.  Physical Exam Physical Exam  Constitutional: She is oriented to person, place, and time. She appears well-developed. No distress.  Pulmonary/Chest: Effort normal.  Genitourinary: Vagina normal. No vaginal discharge found.  No swelling or mass at vulva  Neurological: She is alert and oriented to person, place, and time.  Psychiatric: She has a normal mood and affect. Her behavior is normal.    Data Reviewed [facility] note  Assessment    Doing well after I&D Bartholin's abscess     Plan    Warm soaks prn and report if sx worsen        Nancy Rocha 07/16/2015, 2:40 PM

## 2015-07-16 NOTE — Patient Instructions (Signed)
Bartholin Cyst or Abscess A Bartholin cyst is a fluid-filled sac that forms on a Bartholin gland. Bartholin glands are small glands that are located within the folds of skin (labia) along the sides of the lower opening of the vagina. These glands produce a fluid to moisten the outside of the vagina during sexual intercourse. A Bartholin cyst causes a bulge on the side of the vagina. A cyst that is not large or infected may not cause symptoms or problems. However, if the fluid within the cyst becomes infected, the cyst can turn into an abscess. An abscess may cause discomfort or pain. CAUSES A Bartholin cyst may develop when the duct of the gland becomes blocked. In many cases, the cause of this is not known. Various kinds of bacteria can cause the cyst to become infected and develop into an abscess. RISK FACTORS You may be at an increased risk of developing a Bartholin cyst or abscess if:  You are a woman of reproductive age.  You have a history of previous Bartholin cysts or abscesses.  You have diabetes.  You have a sexually transmitted disease (STD). SIGNS AND SYMPTOMS The severity of symptoms varies depending on the size of the cyst and whether it is infected. Symptoms may include:  A bulge or swelling near the lower opening of your vagina.  Discomfort or pain.  Redness.  Pain during sexual intercourse.  Pain when walking.  Fluid draining from the area. DIAGNOSIS Your health care provider may make a diagnosis based on your symptoms and a physical exam. He or she will look for swelling in your vaginal area. Blood tests may be done to check for infections. A sample of fluid from the cyst or abscess may also be taken to be tested in a lab. TREATMENT Small cysts that are not infected may not require any treatment. These often go away on their own. Yourhealth care provider will recommend hot baths and the use of warm compresses. These may also be part of the treatment for an abscess.  Treatment options for a large cyst or abscess may include:   Antibiotic medicine.  A surgical procedure to drain the abscess. One of the following procedures may be done:  Incision and drainage. An incision is made in the cyst or abscess so that the fluid drains out. A catheter may be placed inside the cyst so that it does not close and fill up with fluid again. The catheter will be removed after you have a follow-up visit with a specialist (gynecologist).  Marsupialization. The cyst or abscess is opened and kept open by stitching the edges of the skin to the walls of the cyst or abscess. This allows it to continue to drain and not fill up with fluid again. If you have cysts or abscesses that keep returning and have required incision and drainage multiple times, your health care provider may talk to you about surgery to remove the Bartholin gland. HOME CARE INSTRUCTIONS  Take medicines only as directed by your health care provider.  If you were prescribed an antibiotic medicine, finish it all even if you start to feel better.  Apply warm, wet compresses to the area or take warm, shallow baths that cover your pelvic region (sitz baths) several times a day or as directed by your health care provider.  Do not squeeze the cyst or apply heavy pressure to it.  Do not have sexual intercourse until the cyst has gone away.  If your cyst or abscess was   opened, a small piece of gauze or a drain may have been placed in the area to allow drainage. Do not remove the gauze or the drain until directed by your health care provider.  Wear feminine pads--not tampons--as needed for any drainage or bleeding.  Keep all follow-up visits as directed by your health care provider. This is important. PREVENTION Take these steps to help prevent a Bartholin cyst from returning:  Practice good hygiene.   Clean your vaginal area with mild soap and a soft cloth when you bathe.  Practice safe sex to prevent  STDs. SEEK MEDICAL CARE IF:  You have increased pain, swelling, or redness in the area of the cyst.  Puslike drainage is coming from the cyst.  You have a fever.   This information is not intended to replace advice given to you by your health care provider. Make sure you discuss any questions you have with your health care provider.   Document Released: 05/05/2005 Document Revised: 05/26/2014 Document Reviewed: 12/19/2013 Elsevier Interactive Patient Education 2016 [employer]  

## 2016-02-29 ENCOUNTER — Emergency Department (HOSPITAL_BASED_OUTPATIENT_CLINIC_OR_DEPARTMENT_OTHER)
Admission: EM | Admit: 2016-02-29 | Discharge: 2016-02-29 | Disposition: A | Discharge status: Home / Self Care | Payer: BLUE CROSS/BLUE SHIELD | Attending: Emergency Medicine | Admitting: Emergency Medicine

## 2016-02-29 ENCOUNTER — Encounter (HOSPITAL_BASED_OUTPATIENT_CLINIC_OR_DEPARTMENT_OTHER): Payer: Self-pay | Admitting: *Deleted

## 2016-02-29 ENCOUNTER — Emergency Department (HOSPITAL_BASED_OUTPATIENT_CLINIC_OR_DEPARTMENT_OTHER): Payer: BLUE CROSS/BLUE SHIELD

## 2016-02-29 DIAGNOSIS — Y939 Activity, unspecified: Secondary | ICD-10-CM | POA: Insufficient documentation

## 2016-02-29 DIAGNOSIS — X58XXXA Exposure to other specified factors, initial encounter: Secondary | ICD-10-CM | POA: Insufficient documentation

## 2016-02-29 DIAGNOSIS — Y929 Unspecified place or not applicable: Secondary | ICD-10-CM | POA: Insufficient documentation

## 2016-02-29 DIAGNOSIS — F17200 Nicotine dependence, unspecified, uncomplicated: Secondary | ICD-10-CM | POA: Insufficient documentation

## 2016-02-29 DIAGNOSIS — Y999 Unspecified external cause status: Secondary | ICD-10-CM | POA: Insufficient documentation

## 2016-02-29 DIAGNOSIS — T189XXA Foreign body of alimentary tract, part unspecified, initial encounter: Secondary | ICD-10-CM | POA: Diagnosis present

## 2016-02-29 DIAGNOSIS — R0989 Other specified symptoms and signs involving the circulatory and respiratory systems: Secondary | ICD-10-CM | POA: Diagnosis not present

## 2016-02-29 MED ORDER — PENTOBARBITAL SODIUM 50 MG/ML IJ SOLN
INTRAMUSCULAR | Status: AC | PRN
  Administered 2016-02-29: 19.06 mg via INTRAVENOUS

## 2016-02-29 MED ORDER — GI COCKTAIL ~~LOC~~
30.0000 mL | Freq: Once | ORAL | Status: AC
  Administered 2016-02-29: 30 mL via ORAL
  Filled 2016-02-29: qty 30

## 2016-02-29 MED ORDER — ONDANSETRON HCL 4 MG/2ML IJ SOLN
4.0000 mg | Freq: Once | INTRAMUSCULAR | Status: AC
  Administered 2016-02-29: 4 mg via INTRAVENOUS
  Filled 2016-02-29: qty 2

## 2016-02-29 MED ORDER — ETOMIDATE 2 MG/ML IV SOLN
0.3000 mg/kg | Freq: Once | INTRAVENOUS | Status: AC
  Administered 2016-02-29: 19.06 mg via INTRAVENOUS
  Filled 2016-02-29: qty 10

## 2016-02-29 MED ORDER — ETOMIDATE 2 MG/ML IV SOLN
0.1500 mg/kg | Freq: Once | INTRAVENOUS | Status: DC
  Filled 2016-02-29: qty 10

## 2016-02-29 NOTE — ED Notes (Signed)
Pt ambulatory to restroom with no difficulties. 

## 2016-02-29 NOTE — ED Provider Notes (Signed)
[facility] Provider Note: Nancy Rocha Salah Burlison, MD, FACEP  CSN: [id number] MRN: [id number] ARRIVAL: 02/29/16 at 0228   CHIEF COMPLAINT  Swallowed Foreign Body   HISTORY OF PRESENT ILLNESS  Nancy Rocha is a 25 y.o. female swallowed a tablet several hours ago. Since then she has had the sensation that the tablet was stuck in the back of her throat. She has tried drinking water multiple times without relief. She has attempted to make herself throw up without relief. She is able to swallow. She is having no difficulty breathing. She is having no difficulty speaking. Symptoms are moderate.   Past Medical History:  Diagnosis Date  . Anxiety   . Anxiety disorder     Past Surgical History:  Procedure Laterality Date  . DENTAL SURGERY    . WISDOM TOOTH EXTRACTION      No family history on file.  Social History  Substance Use Topics  . Smoking status: Light Tobacco Smoker    Packs/day: 0.25  . Smokeless tobacco: Never Used     Comment: smokes hookah  . Alcohol use 0.0 oz/week     Comment: OCCASIONAL    Prior to Admission medications   Medication Sig Start Date End Date Taking? Authorizing Provider  Ascorbic Acid (VITAMIN C) 1000 MG tablet Take 1,000 mg by mouth as needed.    Historical Provider, MD  doxycycline (VIBRAMYCIN) 100 MG capsule Take 1 capsule (100 mg total) by mouth 2 (two) times daily. Patient not taking: Reported on 07/16/2015 07/11/15   Marny LowensteinJulie N Wenzel, PA-C  Homeopathic Products (AZO YEAST PLUS) TABS Take 1 tablet by mouth daily.    Historical Provider, MD  LORazepam (ATIVAN) 0.5 MG tablet Take by mouth. Reported on 07/16/2015    Historical Provider, MD  Multiple Vitamins-Minerals (MULTIVITAMIN & MINERAL PO) Take 1 tablet by mouth daily.    Historical Provider, MD  Norgestimate-Ethinyl Estradiol Triphasic (ORTHO TRI-CYCLEN LO) 0.18/0.215/0.25 MG-25 MCG tab Take by mouth. Reported on 07/16/2015 02/21/15   Historical Provider, MD  oxyCODONE-acetaminophen  (PERCOCET/ROXICET) 5-325 MG tablet Take 1 tablet by mouth every 6 (six) hours as needed for severe pain. Patient not taking: Reported on 07/16/2015 07/11/15   Marny LowensteinJulie N Wenzel, PA-C    Allergies Pollen extract   REVIEW OF SYSTEMS  Negative except as noted here or in the History of Present Illness.   PHYSICAL EXAMINATION  Initial Vital Signs Blood pressure 125/85, pulse 78, temperature 98.3 F (36.8 C), temperature source Oral, resp. rate 18, height 5' 2.5" (1.588 m), weight 140 lb (63.5 kg), last menstrual period 01/28/2016, SpO2 100 %.  Examination General: Well-developed, well-nourished female in no acute distress; appearance consistent with age of record HENT: normocephalic; atraumatic; epiglottis readily visualized without tongue depressor, no edema or erythema of epiglottis oropharynx seen; no dysphonia; no stridor Eyes: pupils equal, round and reactive to light; extraocular muscles intact Neck: supple Heart: regular rate and rhythm Lungs: clear to auscultation bilaterally Abdomen: soft; nondistended; nontender; no masses or hepatosplenomegaly; bowel sounds present Extremities: No deformity; full range of motion; pulses normal Neurologic: Awake, alert and oriented; motor function intact in all extremities and symmetric; no facial droop Skin: Warm and dry Psychiatric: Anxious; tearful   RESULTS  Summary of this visit's results, reviewed by myself:   EKG Interpretation  Date/Time:    Ventricular Rate:    PR Interval:    QRS Duration:   QT Interval:    QTC Calculation:   R Axis:     Text Interpretation:  Laboratory Studies: No results found for this or any previous visit (from the past 24 hour(s)). Imaging Studies: Dg Neck Soft Tissue  Result Date: 02/29/2016 CLINICAL DATA:  Foreign body sensation relieved. Previously a swallowed a pill with foreign body sensation. EXAM: NECK SOFT TISSUES - 1+ VIEW COMPARISON:  Soft tissue neck radiographs earlier this day at  0332 hour FINDINGS: Previously described rounded 3 mm density projecting over the aryepiglottic folds is unchanged in appearance. No new foreign body. Normal epiglottis. The retropharyngeal soft tissue swelling. Cervical airway is patent on lateral view. IMPRESSION: Unchanged 3 mm density projecting over the aryepiglottic folds. Persistent retained pill not excluded. Electronically Signed   By: Rubye Oaks M.D.   On: 02/29/2016 04:37   Dg Neck Soft Tissue  Result Date: 02/29/2016 CLINICAL DATA:  25 y/o F; patient recently swallowed an AZO pill and believe it is stuck in the throat. EXAM: NECK SOFT TISSUES - 1+ VIEW COMPARISON:  None. FINDINGS: There is no evidence of retropharyngeal soft tissue swelling or epiglottic enlargement. The cervical airway is unremarkable. There is a rounded 3 mm faint density projecting over the aryepiglottic folds, question pill. IMPRESSION: There is a rounded 3 mm faint density projecting over the aryepiglottic folds, question pill. Electronically Signed   By: Mitzi Hansen M.D.   On: 02/29/2016 03:51    ED COURSE  Nursing notes and initial vitals signs, including pulse oximetry, reviewed.  Vitals:   02/29/16 0508 02/29/16 0511 02/29/16 0517 02/29/16 0522  BP:   143/88 118/82  Pulse: 97 90 86 68  Resp: 20 20 17 14   Temp:      TempSrc:      SpO2: 100% 100% 100% 100%  Weight:      Height:        PROCEDURES   LARYNGOSCOPY After informed written consent was obtained the patient was placed on a monitor and supplemental oxygen. She was given etomidate and her vocal cords and surrounding area were examined using using a Glidescope assisted by the respiratory therapist. No foreign body was seen by myself or RT. The cords and surrounding area were well suctioned. The patient tolerated this well and there were no immediate complications.   On return to baseline mental status the patient denies foreign body sensation she had earlier.  ED DIAGNOSES      ICD-9-CM ICD-10-CM   1. Sensation of foreign body in throat 784.99 R09.89        [name], MD 02/29/16 [phone]

## 2016-02-29 NOTE — ED Triage Notes (Signed)
Patient is alert and oriented x4.  She is complaining of AZO pill and she feels it in the back of her throat.  Patient has a patent airway on arrival to the ED.  Patient denies any pain .

## 2016-08-06 ENCOUNTER — Encounter (HOSPITAL_COMMUNITY): Payer: Self-pay | Admitting: Emergency Medicine

## 2016-08-06 ENCOUNTER — Emergency Department (HOSPITAL_COMMUNITY)
Admission: EM | Admit: 2016-08-06 | Discharge: 2016-08-07 | Disposition: A | Discharge status: Home / Self Care | Payer: BLUE CROSS/BLUE SHIELD | Attending: Emergency Medicine | Admitting: Emergency Medicine

## 2016-08-06 DIAGNOSIS — L299 Pruritus, unspecified: Secondary | ICD-10-CM | POA: Insufficient documentation

## 2016-08-06 DIAGNOSIS — N898 Other specified noninflammatory disorders of vagina: Secondary | ICD-10-CM

## 2016-08-06 DIAGNOSIS — F17210 Nicotine dependence, cigarettes, uncomplicated: Secondary | ICD-10-CM | POA: Insufficient documentation

## 2016-08-06 DIAGNOSIS — Z79899 Other long term (current) drug therapy: Secondary | ICD-10-CM | POA: Insufficient documentation

## 2016-08-06 DIAGNOSIS — N760 Acute vaginitis: Secondary | ICD-10-CM

## 2016-08-06 LAB — URINALYSIS, ROUTINE W REFLEX MICROSCOPIC
Bilirubin Urine: NEGATIVE
Glucose, UA: NEGATIVE mg/dL
Hgb urine dipstick: NEGATIVE
Ketones, ur: 5 mg/dL — AB
LEUKOCYTES UA: NEGATIVE
NITRITE: NEGATIVE
PH: 7 (ref 5.0–8.0)
Protein, ur: NEGATIVE mg/dL
Specific Gravity, Urine: 1.019 (ref 1.005–1.030)

## 2016-08-06 LAB — POC URINE PREG, ED: PREG TEST UR: NEGATIVE

## 2016-08-06 NOTE — ED Triage Notes (Signed)
Patient reports vaginal ( whitish) discharge with itching/irritation onset this week .

## 2016-08-06 NOTE — ED Provider Notes (Signed)
[facility]PT Provider Note   CSN: [id number] Arrival date & time: 08/06/16  2302     History   Chief Complaint Chief Complaint  Patient presents with  . Vaginal Discharge    HPI Nancy Rocha is a 26 y.o. female.  Patient reports vaginal discharge for the past 5 days with itching.  Denies any discharge.  Patient states she is lesbian and has not had intercourse with a female in more than 3 years.  She has one sexual partner.  They do  share toys.  She is unsure of the cleaning methods used, state they do place condoms on the devices.      Past Medical History:  Diagnosis Date  . Anxiety   . Anxiety disorder     Patient Active Problem List   Diagnosis Date Noted  . Bartholin gland cyst 07/09/2015  . Anxiety 07/09/2015    Past Surgical History:  Procedure Laterality Date  . DENTAL SURGERY    . WISDOM TOOTH EXTRACTION      OB History    Gravida Para Term Preterm AB Living   0 0 0 0 0 0   SAB TAB Ectopic Multiple Live Births   0 0 0 0         Home Medications    Prior to Admission medications   Medication Sig Start Date End Date Taking? Authorizing Provider  Ascorbic Acid (VITAMIN C) 1000 MG tablet Take 1,000 mg by mouth as needed.    Historical Provider, MD  desonide (DESOWEN) 0.05 % cream Apply topically 2 (two) times daily. Apply to external genatalia BID 08/07/16   Earley Favor, NP  doxycycline (VIBRAMYCIN) 100 MG capsule Take 1 capsule (100 mg total) by mouth 2 (two) times daily. Patient not taking: Reported on 07/16/2015 07/11/15   Marny Lowenstein, PA-C  Homeopathic Products (AZO YEAST PLUS) TABS Take 1 tablet by mouth daily.    Historical Provider, MD  LORazepam (ATIVAN) 0.5 MG tablet Take by mouth. Reported on 07/16/2015    Historical Provider, MD  Multiple Vitamins-Minerals (MULTIVITAMIN & MINERAL PO) Take 1 tablet by mouth daily.    Historical Provider, MD  Norgestimate-Ethinyl Estradiol Triphasic (ORTHO TRI-CYCLEN LO) 0.18/0.215/0.25 MG-25 MCG tab  Take by mouth. Reported on 07/16/2015 02/21/15   Historical Provider, MD  oxyCODONE-acetaminophen (PERCOCET/ROXICET) 5-325 MG tablet Take 1 tablet by mouth every 6 (six) hours as needed for severe pain. Patient not taking: Reported on 07/16/2015 07/11/15   Marny Lowenstein, PA-C    Family History No family history on file.  Social History Social History  Substance Use Topics  . Smoking status: Light Tobacco Smoker    Packs/day: 0.25  . Smokeless tobacco: Never Used     Comment: smokes hookah  . Alcohol use 0.0 oz/week     Comment: OCCASIONAL     Allergies   Pollen extract   Review of Systems Review of Systems  Genitourinary: Positive for vaginal discharge. Negative for dysuria.  All other systems reviewed and are negative.    Physical Exam Updated Vital Signs BP 137/86 (BP Location: Left Arm)   Pulse 98   Temp 97.6 F (36.4 C) (Oral)   Resp 18   Ht 5' 2.5" (1.588 m)   SpO2 100%   Physical Exam  Constitutional: She appears well-developed and well-nourished.  HENT:  Head: Normocephalic.  Eyes: Pupils are equal, round, and reactive to light.  Neck: Normal range of motion.  Cardiovascular: Normal rate.   Pulmonary/Chest: Effort normal.  Abdominal:  Soft.  Genitourinary: Pelvic exam was performed with patient supine. No vaginal discharge found.  Musculoskeletal: Normal range of motion.  Neurological: She is alert.  Skin: Skin is warm.  Psychiatric: She has a normal mood and affect.  Nursing note and vitals reviewed.    ED Treatments / Results  Labs (all labs ordered are listed, but only abnormal results are displayed) Labs Reviewed  WET PREP, GENITAL - Abnormal; Notable for the following:       Result Value   WBC, Wet Prep HPF POC FEW (*)    All other components within normal limits  URINALYSIS, ROUTINE W REFLEX MICROSCOPIC - Abnormal; Notable for the following:    APPearance HAZY (*)    Ketones, ur 5 (*)    All other components within normal limits  POC  URINE PREG, ED  GC/CHLAMYDIA PROBE AMP ([place]    EKG  EKG Interpretation None       Radiology No results found.  Procedures Procedures (including critical care time)  Medications Ordered in ED Medications - No data to display   Initial Impression / Assessment and Plan / ED Course  I have reviewed the triage vital signs and the nursing notes.  Pertinent labs & imaging results that were available during my care of the patient were reviewed by me and considered in my medical decision making (see chart for details).       Final Clinical Impressions(s) / ED Diagnoses   Final diagnoses:  Vaginal itching  Acute vaginitis    New Prescriptions New Prescriptions   DESONIDE (DESOWEN) 0.05 % CREAM    Apply topically 2 (two) times daily. Apply to external genatalia BID     Earley FavorGail Sara Selvidge, NP 08/07/16 [id number]    Linwood DibblesJon Knapp, MD 08/07/16 [phone]

## 2016-08-06 NOTE — ED Notes (Signed)
Pt states she is having vaginal irritation and discharge. Pt reports intercourse approximately 2 weeks ago. Pt reports she had BV and trich in November. Pt reports the same symptoms.

## 2016-08-07 LAB — GC/CHLAMYDIA PROBE AMP (~~LOC~~) NOT AT ARMC
Chlamydia: NEGATIVE
Neisseria Gonorrhea: NEGATIVE

## 2016-08-07 LAB — WET PREP, GENITAL
CLUE CELLS WET PREP: NONE SEEN
Sperm: NONE SEEN
TRICH WET PREP: NONE SEEN
Yeast Wet Prep HPF POC: NONE SEEN

## 2016-08-07 MED ORDER — DESONIDE 0.05 % EX CREA: TOPICAL_CREAM | Freq: Two times a day (BID) | CUTANEOUS | 0 refills | Status: DC

## 2016-08-07 NOTE — Discharge Instructions (Signed)
You have been given a prescription for medication called Tessalon.  This is a steroid type medication that you can apply externally twice a day for symptom relief.  The next 5-6 days
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
## 2016-09-18 ENCOUNTER — Emergency Department (HOSPITAL_COMMUNITY)
Admission: EM | Admit: 2016-09-18 | Discharge: 2016-09-18 | Disposition: A | Discharge status: Home / Self Care | Payer: BLUE CROSS/BLUE SHIELD | Attending: Emergency Medicine | Admitting: Emergency Medicine

## 2016-09-18 ENCOUNTER — Encounter (HOSPITAL_COMMUNITY): Payer: Self-pay

## 2016-09-18 DIAGNOSIS — Z202 Contact with and (suspected) exposure to infections with a predominantly sexual mode of transmission: Secondary | ICD-10-CM | POA: Insufficient documentation

## 2016-09-18 DIAGNOSIS — F17200 Nicotine dependence, unspecified, uncomplicated: Secondary | ICD-10-CM | POA: Insufficient documentation

## 2016-09-18 DIAGNOSIS — B9689 Other specified bacterial agents as the cause of diseases classified elsewhere: Secondary | ICD-10-CM

## 2016-09-18 DIAGNOSIS — Z711 Person with feared health complaint in whom no diagnosis is made: Secondary | ICD-10-CM

## 2016-09-18 DIAGNOSIS — N760 Acute vaginitis: Secondary | ICD-10-CM | POA: Insufficient documentation

## 2016-09-18 DIAGNOSIS — N898 Other specified noninflammatory disorders of vagina: Secondary | ICD-10-CM

## 2016-09-18 LAB — URINALYSIS, ROUTINE W REFLEX MICROSCOPIC
Bilirubin Urine: NEGATIVE
Glucose, UA: NEGATIVE mg/dL
Ketones, ur: NEGATIVE mg/dL
Nitrite: NEGATIVE
Protein, ur: NEGATIVE mg/dL
SPECIFIC GRAVITY, URINE: 1.026 (ref 1.005–1.030)
pH: 5 (ref 5.0–8.0)

## 2016-09-18 LAB — WET PREP, GENITAL
SPERM: NONE SEEN
TRICH WET PREP: NONE SEEN
YEAST WET PREP: NONE SEEN

## 2016-09-18 LAB — PREGNANCY, URINE: Preg Test, Ur: NEGATIVE

## 2016-09-18 MED ORDER — METRONIDAZOLE 500 MG PO TABS: 500.0000 mg | ORAL_TABLET | Freq: Two times a day (BID) | ORAL | 0 refills | Status: DC

## 2016-09-18 MED ORDER — AZITHROMYCIN 250 MG PO TABS
1000.0000 mg | ORAL_TABLET | Freq: Once | ORAL | Status: AC
  Administered 2016-09-18: 1000 mg via ORAL
  Filled 2016-09-18: qty 4

## 2016-09-18 MED ORDER — FLUCONAZOLE 100 MG PO TABS
150.0000 mg | ORAL_TABLET | Freq: Once | ORAL | Status: AC
  Administered 2016-09-18: 150 mg via ORAL
  Filled 2016-09-18: qty 2

## 2016-09-18 MED ORDER — CEFTRIAXONE SODIUM 250 MG IJ SOLR
250.0000 mg | Freq: Once | INTRAMUSCULAR | Status: AC
  Administered 2016-09-18: 250 mg via INTRAMUSCULAR
  Filled 2016-09-18: qty 250

## 2016-09-18 MED ORDER — LIDOCAINE HCL (PF) 1 % IJ SOLN
INTRAMUSCULAR | Status: AC
  Administered 2016-09-18: 5 mL
  Filled 2016-09-18: qty 5

## 2016-09-18 NOTE — ED Triage Notes (Signed)
Pt reports thick, clumpy,  foul smelling, yellow vaginal discharge for four days, denies vaginal bleeding.

## 2016-09-18 NOTE — ED Notes (Signed)
Declined W/C at D/C and was escorted to lobby by RN. 

## 2016-09-18 NOTE — Discharge Instructions (Signed)
Would expect improvement over the next few days. Return for any new or worse symptoms. Cultures including urine culture pending.

## 2016-09-18 NOTE — ED Provider Notes (Signed)
[facility]-EMERGENCY DEPT Provider Note   CSN: [id number] Arrival date & time: 09/18/16  1807  By signing my name below, I, Marnette Burgess Long, attest that this documentation has been prepared under the direction and in the presence of Vanetta Mulders, MD. Electronically Signed: Marnette Burgess Long, Scribe. 09/18/2016. 7:08 PM.  History   Chief Complaint Chief Complaint  Patient presents with  . Vaginal Discharge   The history is provided by the patient and medical records. No language interpreter was used.  Vaginal Discharge   This is a new problem. The current episode started more than 2 days ago. The problem occurs constantly. The problem has not changed since onset.The discharge was malodorous, yellow and thick. She has not missed her period. Associated symptoms include abdominal pain and nausea. Pertinent negatives include no fever, no diarrhea, no vomiting and no dysuria. She has tried nothing for the symptoms. The treatment provided no relief.    HPI Comments:  Nancy Rocha is a 26 y.o. female with a PMHx of Anxiety Disorder, who presents to the Emergency Department complaining of thick, clumpy, malodorous, yellow vaginal discharge onset four days ago. Pt reports for the past four days, she has experienced persistent vaginal discharge leading her to be seen in the ED today. She notes she has some slight concern for a STD at this time. Pt has associated symptoms of nausea, that is resolved currently and abdominal cramps though she states her menstrual period is about to start. No alleviating factors noted. Pt denies fever, congestion, rhinorrhea, sore throat, visual disturbance, cough, SOB, CP, leg swelling, diarrhea, vomiting, dysuria, hematuria, vaginal bleeding, back pain, neck pain, rash, HA, bruising/bleeding easily. Pt is not currently on anticoagulants. LMP~ beginning of April.   Past Medical History:  Diagnosis Date  . Anxiety   . Anxiety disorder    Patient Active Problem List   Diagnosis Date Noted  . Bartholin gland cyst 07/09/2015  . Anxiety 07/09/2015   Past Surgical History:  Procedure Laterality Date  . DENTAL SURGERY    . WISDOM TOOTH EXTRACTION     OB History    Gravida Para Term Preterm AB Living   0 0 0 0 0 0   SAB TAB Ectopic Multiple Live Births   0 0 0 0       Home Medications    Prior to Admission medications   Medication Sig Start Date End Date Taking? Authorizing Provider  Ascorbic Acid (VITAMIN C) 1000 MG tablet Take 1,000 mg by mouth as needed.    Historical Provider, MD  desonide (DESOWEN) 0.05 % cream Apply topically 2 (two) times daily. Apply to external genatalia BID 08/07/16   Earley Favor, NP  doxycycline (VIBRAMYCIN) 100 MG capsule Take 1 capsule (100 mg total) by mouth 2 (two) times daily. Patient not taking: Reported on 07/16/2015 07/11/15   Marny Lowenstein, PA-C  Homeopathic Products (AZO YEAST PLUS) TABS Take 1 tablet by mouth daily.    Historical Provider, MD  LORazepam (ATIVAN) 0.5 MG tablet Take by mouth. Reported on 07/16/2015    Historical Provider, MD  Multiple Vitamins-Minerals (MULTIVITAMIN & MINERAL PO) Take 1 tablet by mouth daily.    Historical Provider, MD  Norgestimate-Ethinyl Estradiol Triphasic (ORTHO TRI-CYCLEN LO) 0.18/0.215/0.25 MG-25 MCG tab Take by mouth. Reported on 07/16/2015 02/21/15   Historical Provider, MD  oxyCODONE-acetaminophen (PERCOCET/ROXICET) 5-325 MG tablet Take 1 tablet by mouth every 6 (six) hours as needed for severe pain. Patient not taking: Reported on 07/16/2015 07/11/15   Raynelle Fanning  Sherolyn BubaN Wenzel, PA-C   Family History No family history on file.  Social History Social History  Substance Use Topics  . Smoking status: Light Tobacco Smoker    Packs/day: 0.25  . Smokeless tobacco: Never Used     Comment: smokes hookah  . Alcohol use 0.0 oz/week     Comment: OCCASIONAL   Allergies   Pollen extract  Review of Systems Review of Systems  Constitutional: Negative for fever.  HENT: Negative for  congestion, rhinorrhea and sore throat.   Eyes: Negative for visual disturbance.  Respiratory: Negative for cough and shortness of breath.   Cardiovascular: Negative for chest pain and leg swelling.  Gastrointestinal: Positive for abdominal pain and nausea. Negative for diarrhea and vomiting.  Genitourinary: Positive for vaginal discharge. Negative for dysuria, hematuria and vaginal bleeding.  Musculoskeletal: Negative for back pain and neck pain.  Skin: Negative for rash.  Neurological: Negative for headaches.  Hematological: Does not bruise/bleed easily.   Physical Exam Updated Vital Signs BP 130/85 (BP Location: Left Arm)   Pulse 89   Temp 97.5 F (36.4 C) (Oral)   Resp 16   LMP 08/19/2016   SpO2 100%   Physical Exam  Constitutional: She is oriented to person, place, and time. She appears well-developed and well-nourished.  HENT:  Head: Normocephalic.  Mucus membranes moist.  Eyes: Conjunctivae are normal. Pupils are equal, round, and reactive to light.  Pupils normal, sclera clear, eyes tracking normally.  Neck: Normal range of motion.  Cardiovascular: Normal rate, regular rhythm and normal heart sounds.  Exam reveals no gallop and no friction rub.   No murmur heard. No BLE edema.   Pulmonary/Chest: Effort normal and breath sounds normal. No respiratory distress. She has no wheezes. She has no rales.  Lungs clear bilaterally.   Abdominal: Bowel sounds are normal. She exhibits no distension. There is no tenderness.  Genitourinary:  Genitourinary Comments: External genitalia without any abnormalities no lesions. The whitish yellowish discharge. No cervical motion tenderness. No uterine tenderness. No adnexal tenderness.  Musculoskeletal: Normal range of motion.  Neurological: She is alert and oriented to person, place, and time.  Skin: Skin is warm and dry.  Psychiatric: She has a normal mood and affect.  Nursing note and vitals reviewed.  FEMALE CHAPERONE PRESENT FOR  PHYSICAL EXAMINATION ED Treatments / Results  DIAGNOSTIC STUDIES:  Oxygen Saturation is 100% on RA, normal by my interpretation.    COORDINATION OF CARE:  7:07 PM Discussed treatment plan with pt at bedside including wet prep, UA, lab work, Rocephin, Zithromax, and Flagyl and pt agreed to plan.  Labs (all labs ordered are listed, but only abnormal results are displayed) Labs Reviewed  WET PREP, GENITAL - Abnormal; Notable for the following:       Result Value   Clue Cells Wet Prep HPF POC PRESENT (*)    WBC, Wet Prep HPF POC MODERATE (*)    All other components within normal limits  URINALYSIS, ROUTINE W REFLEX MICROSCOPIC - Abnormal; Notable for the following:    APPearance HAZY (*)    Hgb urine dipstick SMALL (*)    Leukocytes, UA LARGE (*)    Bacteria, UA FEW (*)    Squamous Epithelial / LPF 0-5 (*)    All other components within normal limits  URINE CULTURE  RPR  HIV ANTIBODY (ROUTINE TESTING)  PREGNANCY, URINE  I-STAT BETA HCG BLOOD, ED ([facility], [facility], [facility] ONLY)  POC URINE PREG, ED  GC/CHLAMYDIA PROBE AMP ([place]) NOT  AT [facility]    EKG  EKG Interpretation None       Radiology No results found.  Procedures Procedures (including critical care time)  Medications Ordered in ED Medications  cefTRIAXone (ROCEPHIN) injection 250 mg (not administered)     Initial Impression / Assessment and Plan / ED Course  I have reviewed the triage vital signs and the nursing notes.  Pertinent labs & imaging results that were available during my care of the patient were reviewed by me and considered in my medical decision making (see chart for details).     The patient was some concern for possible STD. But mostly concerned about vaginal discharge. Patient does want to be treated for possible STD. Wet prep without trichomonas. No yeast. Does have a lot of clue cells. Very well could be bacterial vaginosis. Awaiting a pregnancy test. If pregnancy test is negative patient will be  treated the with 1 g of Zithromax here. And continued on Flagyl at home for 7 days. Patient's ovaries received IM Rocephin here. Patient has problem getting yeast infections so received 1 dose of Diflucan here.  Patient's pelvic exam not consistent with PID.  Pregnancy test negative.  Final Clinical Impressions(s) / ED Diagnoses   Final diagnoses:  Vaginal discharge  BV (bacterial vaginosis)  Concern about STD in female without diagnosis    New Prescriptions New Prescriptions   No medications on file    I personally performed the services described in this documentation, which was scribed in my presence. The recorded information has been reviewed and is accurate.       Vanetta Mulders, MD 09/18/16 2048

## 2016-09-19 LAB — HIV ANTIBODY (ROUTINE TESTING W REFLEX): HIV Screen 4th Generation wRfx: NONREACTIVE

## 2016-09-19 LAB — GC/CHLAMYDIA PROBE AMP (~~LOC~~) NOT AT ARMC
CHLAMYDIA, DNA PROBE: NEGATIVE
NEISSERIA GONORRHEA: NEGATIVE

## 2016-09-19 LAB — RPR: RPR Ser Ql: NONREACTIVE

## 2016-09-20 LAB — URINE CULTURE

## 2017-04-10 ENCOUNTER — Encounter (HOSPITAL_BASED_OUTPATIENT_CLINIC_OR_DEPARTMENT_OTHER): Payer: Self-pay

## 2017-04-10 ENCOUNTER — Other Ambulatory Visit: Payer: Self-pay

## 2017-04-10 ENCOUNTER — Emergency Department (HOSPITAL_BASED_OUTPATIENT_CLINIC_OR_DEPARTMENT_OTHER)
Admission: EM | Admit: 2017-04-10 | Discharge: 2017-04-10 | Disposition: A | Discharge status: Home / Self Care | Payer: BLUE CROSS/BLUE SHIELD | Attending: Emergency Medicine | Admitting: Emergency Medicine

## 2017-04-10 DIAGNOSIS — F17200 Nicotine dependence, unspecified, uncomplicated: Secondary | ICD-10-CM | POA: Insufficient documentation

## 2017-04-10 DIAGNOSIS — J069 Acute upper respiratory infection, unspecified: Secondary | ICD-10-CM | POA: Insufficient documentation

## 2017-04-10 NOTE — ED Notes (Signed)
Patient is not in the room to be d/c'd

## 2017-04-10 NOTE — ED Provider Notes (Signed)
[facility] EMERGENCY DEPARTMENT Provider Note   CSN: [id number] Arrival date & time: 04/10/17  1309     History   Chief Complaint Chief Complaint  Patient presents with  . Cough    HPI Arlana HoveJessianna Milbourne is a 26 y.o. female.  Patient is a 26 year old female who has a history of anxiety who presents with runny nose and congestion.  She states she has had a 4-day history of nasal congestion runny nose and a tickle in her throat.  She has ear congestion as well.  No significant cough or chest congestion.  No shortness of breath.  No nausea or vomiting.  No fevers.  She feels like her lymph nodes are swollen and she is concerned that she has an infection.  She has not been using any medications for symptomatic relief.      Past Medical History:  Diagnosis Date  . Anxiety   . Anxiety disorder     Patient Active Problem List   Diagnosis Date Noted  . Bartholin gland cyst 07/09/2015  . Anxiety 07/09/2015    Past Surgical History:  Procedure Laterality Date  . DENTAL SURGERY    . WISDOM TOOTH EXTRACTION      OB History    Gravida Para Term Preterm AB Living   0 0 0 0 0 0   SAB TAB Ectopic Multiple Live Births   0 0 0 0         Home Medications    Prior to Admission medications   Not on File    Family History No family history on file.  Social History Social History   Tobacco Use  . Smoking status: Current Some Day Smoker    Packs/day: 0.25  . Smokeless tobacco: Never Used  . Tobacco comment: smokes hookah  Substance Use Topics  . Alcohol use: Yes    Alcohol/week: 0.0 oz    Comment: occ  . Drug use: No     Allergies   Pollen extract   Review of Systems Review of Systems  Constitutional: Negative for chills, diaphoresis, fatigue and fever.  HENT: Positive for congestion and rhinorrhea. Negative for facial swelling, sinus pain, sneezing, sore throat and trouble swallowing.   Eyes: Negative.   Respiratory: Negative for cough, chest  tightness and shortness of breath.   Cardiovascular: Negative for chest pain and leg swelling.  Gastrointestinal: Negative for abdominal pain, blood in stool, diarrhea, nausea and vomiting.  Genitourinary: Negative for difficulty urinating, flank pain, frequency and hematuria.  Musculoskeletal: Negative for arthralgias and back pain.  Skin: Negative for rash.  Neurological: Negative for dizziness, speech difficulty, weakness, numbness and headaches.     Physical Exam Updated Vital Signs BP 131/89 (BP Location: Left Arm)   Pulse 93   Temp 98.2 F (36.8 C) (Oral)   Resp 18   Ht 5\' 2"  (1.575 m)   Wt 59.4 kg (131 lb)   LMP 03/19/2017   SpO2 100%   BMI 23.96 kg/m   Physical Exam  Constitutional: She is oriented to person, place, and time. She appears well-developed and well-nourished.  HENT:  Head: Normocephalic and atraumatic.  Right Ear: External ear normal.  Left Ear: External ear normal.  Mouth/Throat: Oropharynx is clear and moist. No oropharyngeal exudate.  Eyes: Pupils are equal, round, and reactive to light.  Neck: Normal range of motion. Neck supple.  Cardiovascular: Normal rate, regular rhythm and normal heart sounds.  Pulmonary/Chest: Effort normal and breath sounds normal. No respiratory distress. She  has no wheezes. She has no rales. She exhibits no tenderness.  Abdominal: Soft. Bowel sounds are normal. There is no tenderness. There is no rebound and no guarding.  Musculoskeletal: Normal range of motion. She exhibits no edema.  Lymphadenopathy:    She has cervical adenopathy.  Neurological: She is alert and oriented to person, place, and time.  Skin: Skin is warm and dry. No rash noted.  Psychiatric: She has a normal mood and affect.     ED Treatments / Results  Labs (all labs ordered are listed, but only abnormal results are displayed) Labs Reviewed - No data to display  EKG  EKG Interpretation None       Radiology No results  found.  Procedures Procedures (including critical care time)  Medications Ordered in ED Medications - No data to display   Initial Impression / Assessment and Plan / ED Course  I have reviewed the triage vital signs and the nursing notes.  Pertinent labs & imaging results that were available during my care of the patient were reviewed by me and considered in my medical decision making (see chart for details).     Patient is well-appearing with symptoms consistent with a viral URI.  Her lungs are clear without evidence of pneumonia.  There is no evidence of otitis media or sinusitis.  She was advised in symptomatic care.  Return precautions were given.  Final Clinical Impressions(s) / ED Diagnoses   Final diagnoses:  Viral upper respiratory tract infection    ED Discharge Orders    None       Rolan BuccoBelfi, Boden Stucky, MD 04/10/17 1436

## 2017-04-10 NOTE — ED Triage Notes (Signed)
C/o flu like sx x 4 days-NAD-steady gait 

## 2017-09-23 ENCOUNTER — Inpatient Hospital Stay (HOSPITAL_COMMUNITY)
Admission: RE | Admit: 2017-09-23 | Discharge: 2017-09-23 | Disposition: A | Discharge status: Home / Self Care | Payer: Managed Care, Other (non HMO) | Source: Ambulatory Visit | Attending: Obstetrics and Gynecology | Admitting: Obstetrics and Gynecology

## 2017-09-23 DIAGNOSIS — F17210 Nicotine dependence, cigarettes, uncomplicated: Secondary | ICD-10-CM | POA: Diagnosis not present

## 2017-09-23 DIAGNOSIS — N760 Acute vaginitis: Secondary | ICD-10-CM | POA: Diagnosis not present

## 2017-09-23 DIAGNOSIS — N898 Other specified noninflammatory disorders of vagina: Secondary | ICD-10-CM

## 2017-09-23 DIAGNOSIS — B9689 Other specified bacterial agents as the cause of diseases classified elsewhere: Secondary | ICD-10-CM | POA: Insufficient documentation

## 2017-09-23 LAB — URINALYSIS, ROUTINE W REFLEX MICROSCOPIC
Bilirubin Urine: NEGATIVE
Glucose, UA: NEGATIVE mg/dL
Hgb urine dipstick: NEGATIVE
Ketones, ur: NEGATIVE mg/dL
Leukocytes, UA: NEGATIVE
Nitrite: NEGATIVE
Protein, ur: NEGATIVE mg/dL
Specific Gravity, Urine: 1.02 (ref 1.005–1.030)
pH: 6 (ref 5.0–8.0)

## 2017-09-23 LAB — POCT PREGNANCY, URINE: PREG TEST UR: NEGATIVE

## 2017-09-23 LAB — WET PREP, GENITAL
Sperm: NONE SEEN
Trich, Wet Prep: NONE SEEN
Yeast Wet Prep HPF POC: NONE SEEN

## 2017-09-23 MED ORDER — METRONIDAZOLE 500 MG PO TABS: 500.0000 mg | ORAL_TABLET | Freq: Two times a day (BID) | ORAL | 0 refills | Status: AC

## 2017-09-23 NOTE — MAU Note (Signed)
Pt reports vaginal irritation and odor that started prior to period, but returned soon after. States she has a gray/green discharge. LMP: 09/14/2017

## 2017-09-23 NOTE — MAU Provider Note (Signed)
Chief Complaint: Vaginal Discharge  SUBJECTIVE HPI: Nancy Rocha is a 27 y.o. G0P0000 not currently pregnant who presents to maternity admissions reporting vaginal discharge with odor and vaginal irritation. She reports symptoms began before her period last week and return soon after it was over. She describes her discharge as being grayish green that has a mild odor with it, she reports the odor got worse after her cycle ending. She denies recent IC or change in partners. She denies wanting to be testing for other STDs. She reports the vaginal irritation has continued, denies any soreness or lesions, reports vaginal itching but no burning. She reports wear a pad or panty liner everyday. She denies abdominal pain, vaginal bleeding,  urinary symptoms, h/a, dizziness, n/v, or fever/chills. She reports a hx of BV.    Past Medical History:  Diagnosis Date  . Anxiety   . Anxiety disorder    Past Surgical History:  Procedure Laterality Date  . DENTAL SURGERY    . WISDOM TOOTH EXTRACTION     Social History   Socioeconomic History  . Marital status: Single    Spouse name: Not on file  . Number of children: Not on file  . Years of education: Not on file  . Highest education level: Not on file  Occupational History  . Not on file  Social Needs  . Financial resource strain: Not on file  . Food insecurity:    Worry: Not on file    Inability: Not on file  . Transportation needs:    Medical: Not on file    Non-medical: Not on file  Tobacco Use  . Smoking status: Current Some Day Smoker    Packs/day: 0.25  . Smokeless tobacco: Never Used  . Tobacco comment: smokes hookah  Substance and Sexual Activity  . Alcohol use: Yes    Alcohol/week: 0.0 oz    Comment: occ  . Drug use: No  . Sexual activity: Not on file  Lifestyle  . Physical activity:    Days per week: Not on file    Minutes per session: Not on file  . Stress: Not on file  Relationships  . Social connections:    Talks on  phone: Not on file    Gets together: Not on file    Attends religious service: Not on file    Active member of club or organization: Not on file    Attends meetings of clubs or organizations: Not on file    Relationship status: Not on file  . Intimate partner violence:    Fear of current or ex partner: Not on file    Emotionally abused: Not on file    Physically abused: Not on file    Forced sexual activity: Not on file  Other Topics Concern  . Not on file  Social History Narrative  . Not on file   No current facility-administered medications on file prior to encounter.    No current outpatient medications on file prior to encounter.   Allergies  Allergen Reactions  . Pollen Extract     ROS:  Review of Systems  Cardiovascular: Negative.   Gastrointestinal: Negative.   Genitourinary: Positive for vaginal discharge. Negative for difficulty urinating, dysuria, genital sores, menstrual problem, urgency, vaginal bleeding and vaginal pain.       Vaginal irritation    I have reviewed patient's Past Medical Hx, Surgical Hx, Family Hx, Social Hx, medications and allergies.   Physical Exam   Patient Vitals for the past  24 hrs:  BP Temp Temp src Pulse Resp SpO2 Height Weight  09/23/17 2318 136/84 - - 69 18 - - -  09/23/17 2153 136/88 97.9 F (36.6 C) Oral 64 16 100 % 5' 2.5" (1.588 m) 145 lb (65.8 kg)   Constitutional: Well-developed, well-nourished female in no acute distress.  Cardiovascular: normal rate Respiratory: normal effort GI: Abd soft, non-tender. Pos BS x 4   LAB RESULTS Results for orders placed or performed during the hospital encounter of 09/23/17 (from the past 24 hour(s))  Urinalysis, Routine w reflex microscopic     Status: Abnormal   Collection Time: 09/23/17  9:55 PM  Result Value Ref Range   Color, Urine YELLOW YELLOW   APPearance CLOUDY (A) CLEAR   Specific Gravity, Urine 1.020 1.005 - 1.030   pH 6.0 5.0 - 8.0   Glucose, UA NEGATIVE NEGATIVE mg/dL    Hgb urine dipstick NEGATIVE NEGATIVE   Bilirubin Urine NEGATIVE NEGATIVE   Ketones, ur NEGATIVE NEGATIVE mg/dL   Protein, ur NEGATIVE NEGATIVE mg/dL   Nitrite NEGATIVE NEGATIVE   Leukocytes, UA NEGATIVE NEGATIVE  Wet prep, genital     Status: Abnormal   Collection Time: 09/23/17  9:55 PM  Result Value Ref Range   Yeast Wet Prep HPF POC NONE SEEN NONE SEEN   Trich, Wet Prep NONE SEEN NONE SEEN   Clue Cells Wet Prep HPF POC PRESENT (A) NONE SEEN   WBC, Wet Prep HPF POC FEW (A) NONE SEEN   Sperm NONE SEEN   Pregnancy, urine POC     Status: None   Collection Time: 09/23/17 10:06 PM  Result Value Ref Range   Preg Test, Ur NEGATIVE NEGATIVE   MAU Management/MDM: Orders Placed This Encounter  Procedures  . Wet prep, genital  . Urinalysis, Routine w reflex microscopic  . Pregnancy, urine POC  . Discharge patient Discharge disposition: 01-Home or Self Care; Discharge patient date: 09/23/2017   Wet prep- positive for clue cells, will treat for BV based on clinical symptoms  UA- negative   Meds ordered this encounter  Medications  . metroNIDAZOLE (FLAGYL) 500 MG tablet    Sig: Take 1 tablet (500 mg total) by mouth 2 (two) times daily.    Dispense:  14 tablet    Refill:  0    Order Specific Question:   Supervising Provider    Answer:   Levie Heritage [[id number]]   Discussed importance of airing out at night, not wearing underwear to bed and not wearing a panty liner daily that will cause continued irritation. Patient verbalizes understanding.    Pt discharged. Pt stable prior to discharge. Rx sent to pharmacy of choice.   ASSESSMENT 1. BV (bacterial vaginosis)   2. Vaginal discharge     PLAN Discharge home Follow up with PCP for primary care Return to urgent care or PCP for worsening symptoms   Follow-up Information    Devra Dopp, MD Follow up.   Specialty:  Family Medicine Why:  Follow up as scheduled with PCP for care or return to urgent care for worsening symptoms   Contact information: [address] [place] [address] [phone]           Allergies as of 09/23/2017      Reactions   Pollen Extract       Medication List    TAKE these medications   metroNIDAZOLE 500 MG tablet Commonly known as:  FLAGYL Take 1 tablet (500 mg total)  by mouth 2 (two) times daily.        Steward Drone  Certified Nurse-Midwife 09/23/2017  11:05 PM

## 2017-09-23 NOTE — Discharge Instructions (Signed)
In late 2019, the [facility] will be moving to the [facility] campus. At that time, the MAU (Maternity Admissions Unit), where you are being seen today, will no longer take care of non-pregnant patients. We strongly encourage you to find a doctor's office before that time, so that you can be seen with any GYN concerns, like vaginal discharge, urinary tract infection, etc.. in a timely manner.  In order to make an office visit more convenient, the Center for [facility] at [facility] will be offering evening hours with same-day appointments, walk-in appointments and scheduled appointments available during this time.  Center for [facility] @ [facility] Hours: Monday - 8am - 7:30 pm with walk-in between 4pm- 7:30 pm Tuesday - 8 am - 5 pm (starting 08/18/17 we will be open late and accepting walk-ins from 4pm - 7:30pm) Wednesday - 8 am - 5 pm (starting 11/18/17 we will be open late and accepting walk-ins from 4pm - 7:30pm) Thursday 8 am - 5 pm (starting 02/18/18 we will be open late and accepting walk-ins from 4pm - 7:30pm) Friday 8 am - 5 pm  For an appointment please call the Center for [facility] @ [facility] at [phone]  For urgent needs, [facility] is also available for management of urgent GYN complaints such as vaginal discharge or urinary tract infections.      Bacterial Vaginosis Bacterial vaginosis is an infection of the vagina. It happens when too many germs (bacteria) grow in the vagina. This infection puts you at risk for infections from sex (STIs). Treating this infection can lower your risk for some STIs. You should also treat this if you are pregnant. It can cause your baby to be born early. Follow these instructions at home: Medicines  Take over-the-counter and prescription medicines only as told by your doctor.  Take or use your antibiotic medicine as told by your doctor. Do not stop taking or using it even if you start  to feel better. General instructions  If you your sexual partner is a woman, tell her that you have this infection. She needs to get treatment if she has symptoms. If you have a female partner, he does not need to be treated.  During treatment: ? Avoid sex. ? Do not douche. ? Avoid alcohol as told. ? Avoid breastfeeding as told.  Drink enough fluid to keep your pee (urine) clear or pale yellow.  Keep your vagina and butt (rectum) clean. ? Wash the area with warm water every day. ? Wipe from front to back after you use the toilet.  Keep all follow-up visits as told by your doctor. This is important. Preventing this condition  Do not douche.  Use only warm water to wash around your vagina.  Use protection when you have sex. This includes: ? Latex condoms. ? Dental dams.  Limit how many people you have sex with. It is best to only have sex with the same person (be monogamous).  Get tested for STIs. Have your partner get tested.  Wear underwear that is cotton or lined with cotton.  Avoid tight pants and pantyhose. This is most important in summer.  Do not use any products that have nicotine or tobacco in them. These include cigarettes and e-cigarettes. If you need help quitting, ask your doctor.  Do not use illegal drugs.  Limit how much alcohol you drink. Contact a doctor if:  Your symptoms do not get better, even after you are treated.  You have more discharge or pain when you pee (urinate).  You have a fever.  You have pain in your belly (abdomen).  You have pain with sex.  Your bleed from your vagina between periods. Summary  This infection happens when too many germs (bacteria) grow in the vagina.  Treating this condition can lower your risk for some infections from sex (STIs).  You should also treat this if you are pregnant. It can cause early (premature) birth.  Do not stop taking or using your antibiotic medicine even if you start to feel better. This  information is not intended to replace advice given to you by your health care provider. Make sure you discuss any questions you have with your health care provider. Document Released: 02/12/2008 Document Revised: 01/19/2016 Document Reviewed: 01/19/2016 Elsevier Interactive Patient Education  2017 [employer].

## 2017-09-24 LAB — GC/CHLAMYDIA PROBE AMP (~~LOC~~) NOT AT ARMC
Chlamydia: NEGATIVE
Neisseria Gonorrhea: NEGATIVE

## 2018-04-02 IMAGING — DX DG NECK SOFT TISSUE
2 series · 2 of 2 positions shown · non-contrast
Comparison: None.

CLINICAL DATA: 24 y/o F; patient recently swallowed an Petzone Sapr and
believe it is stuck in the throat.

EXAM:
NECK SOFT TISSUES - 1+ VIEW

[neck lat]
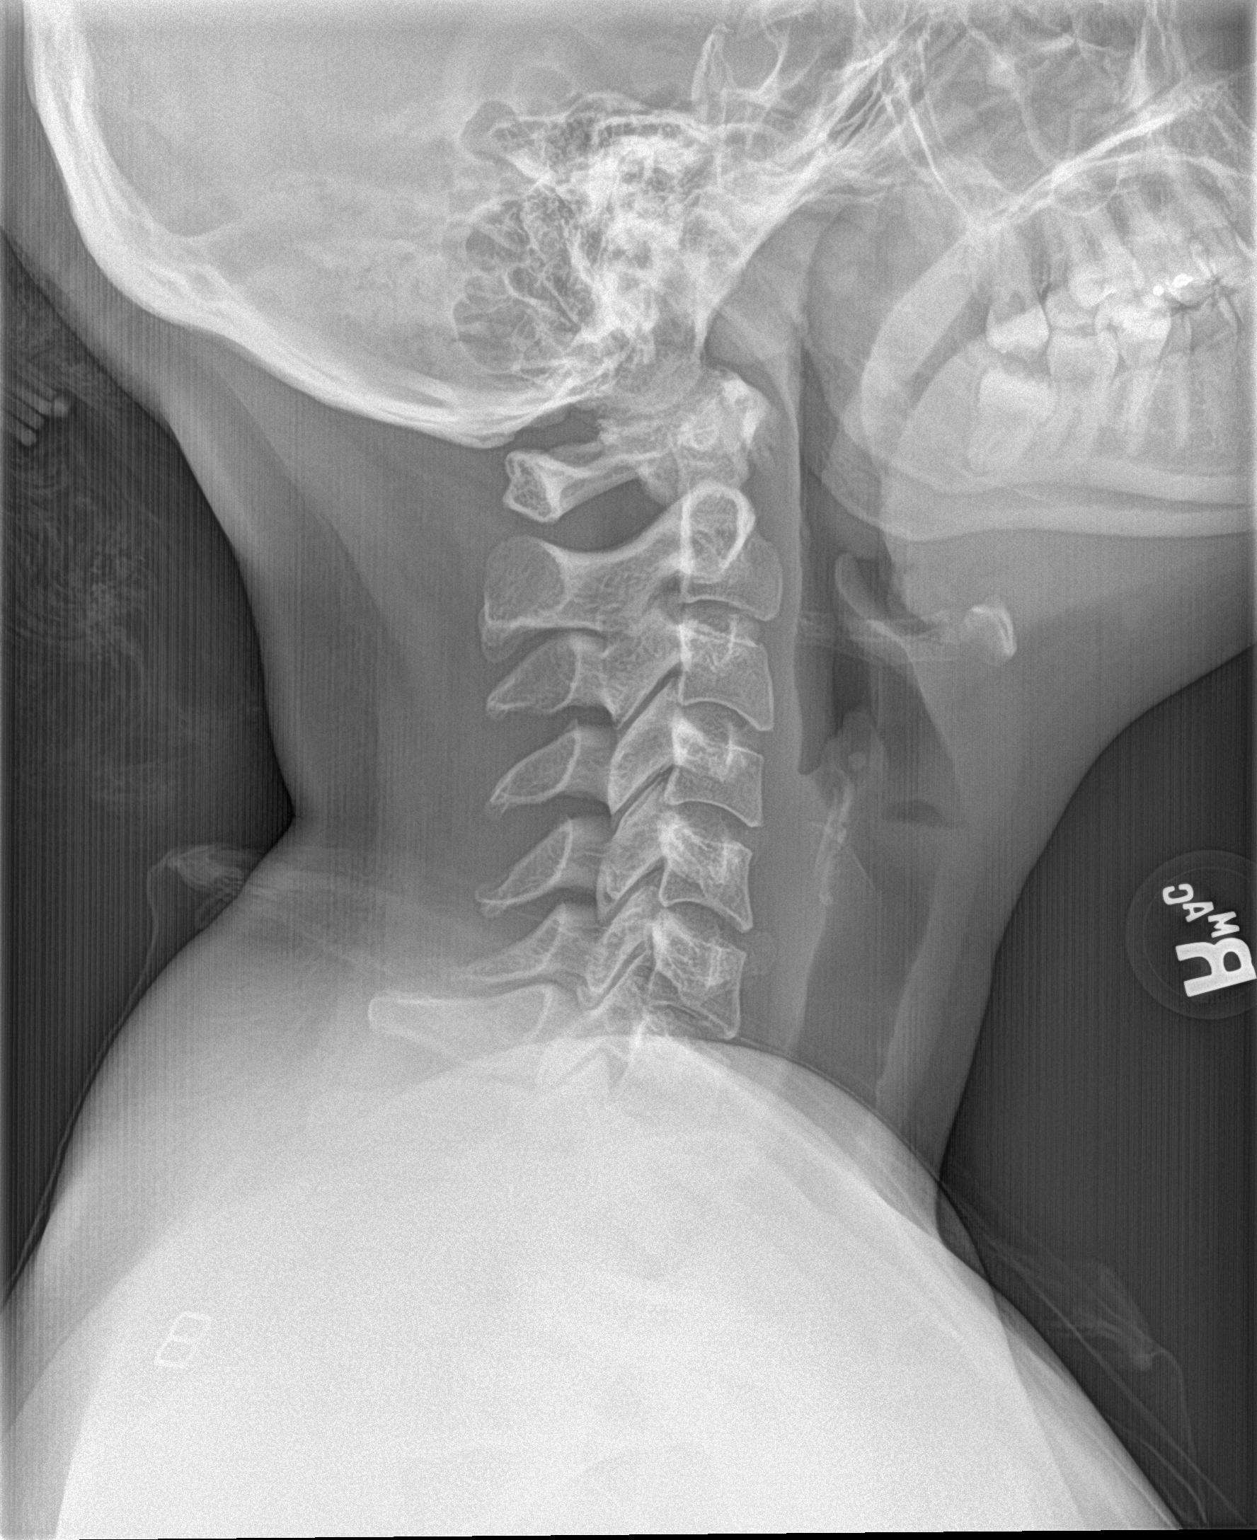

[neck ap]
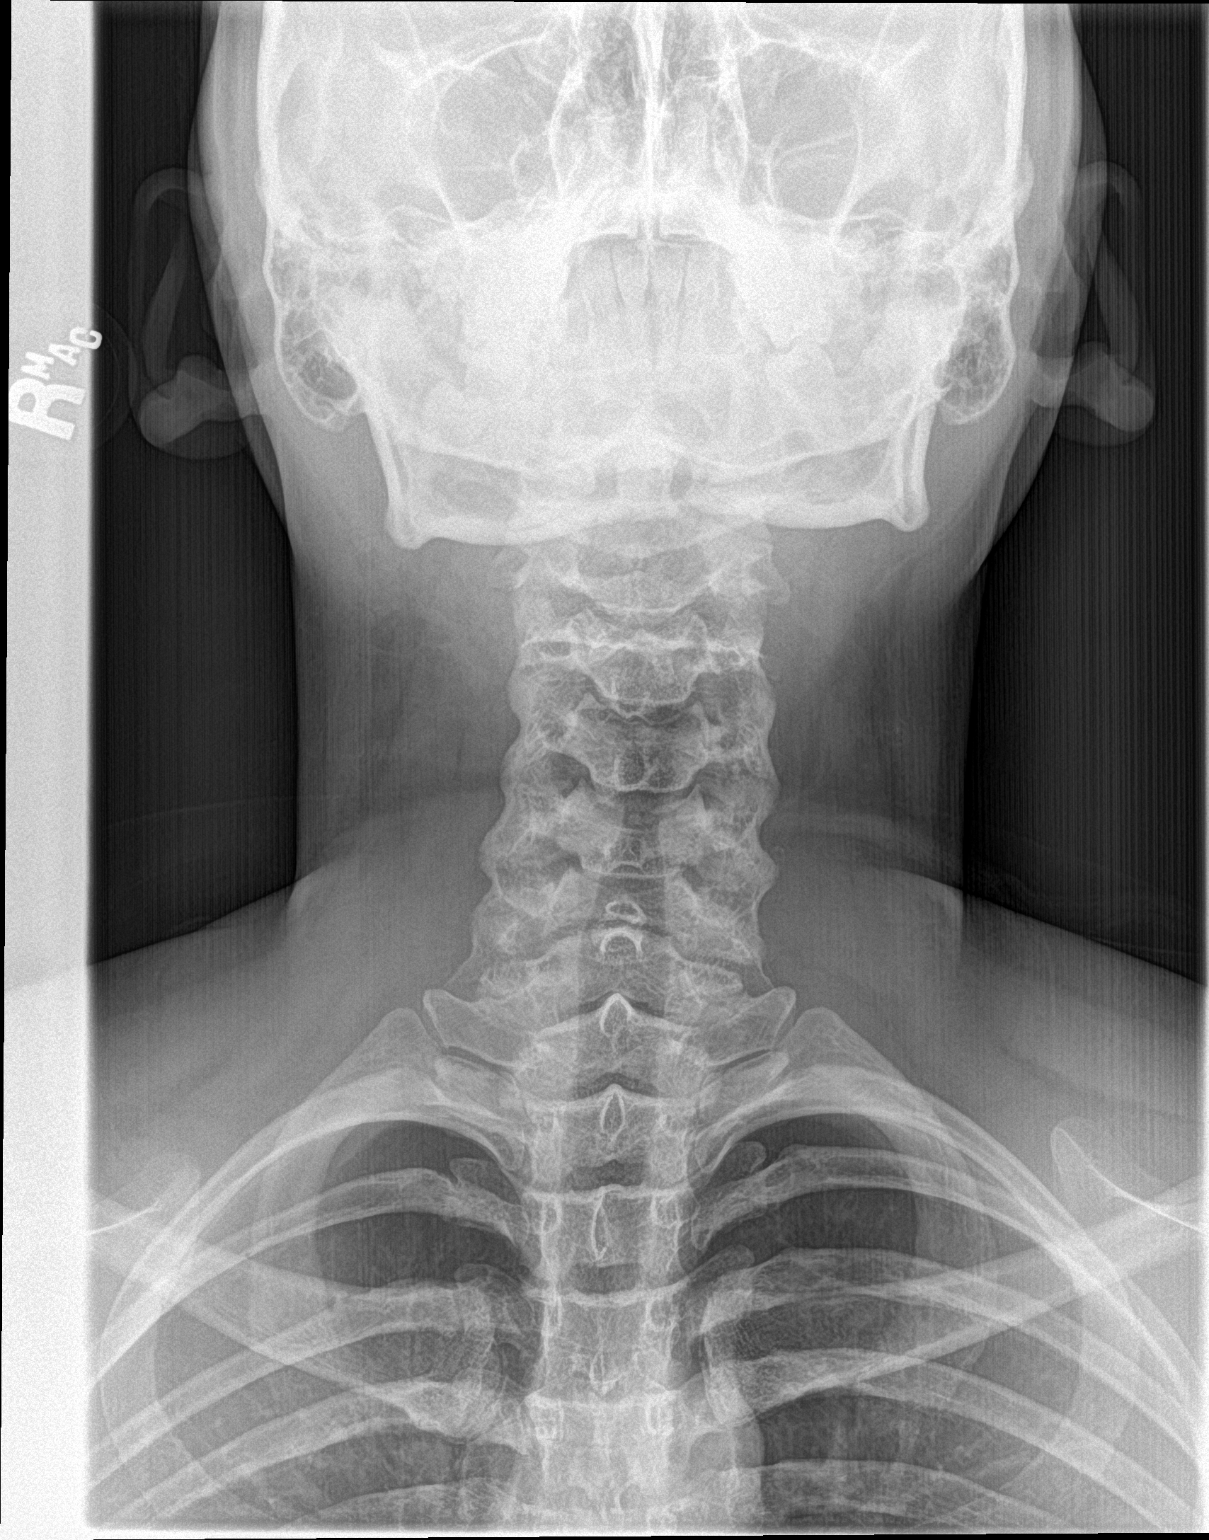

[2 of 2 positions shown; findings below may reference images not displayed]

FINDINGS: There is no evidence of retropharyngeal soft tissue swelling or
epiglottic enlargement. The cervical airway is unremarkable. There
is a rounded 3 mm faint density projecting over the aryepiglottic
folds, question pill.
IMPRESSION: There is a rounded 3 mm faint density projecting over the
aryepiglottic folds, question pill.

By: Relindas Auad M.D.

## 2018-04-30 ENCOUNTER — Emergency Department (HOSPITAL_COMMUNITY)
Admission: EM | Admit: 2018-04-30 | Discharge: 2018-04-30 | Disposition: A | Discharge status: Home / Self Care | Payer: Managed Care, Other (non HMO) | Attending: Emergency Medicine | Admitting: Emergency Medicine

## 2018-04-30 ENCOUNTER — Other Ambulatory Visit: Payer: Self-pay

## 2018-04-30 DIAGNOSIS — F17200 Nicotine dependence, unspecified, uncomplicated: Secondary | ICD-10-CM | POA: Insufficient documentation

## 2018-04-30 DIAGNOSIS — N3000 Acute cystitis without hematuria: Secondary | ICD-10-CM | POA: Diagnosis not present

## 2018-04-30 DIAGNOSIS — Z79899 Other long term (current) drug therapy: Secondary | ICD-10-CM | POA: Insufficient documentation

## 2018-04-30 DIAGNOSIS — R350 Frequency of micturition: Secondary | ICD-10-CM | POA: Diagnosis present

## 2018-04-30 LAB — URINALYSIS, ROUTINE W REFLEX MICROSCOPIC
BILIRUBIN URINE: NEGATIVE
Glucose, UA: NEGATIVE mg/dL
Ketones, ur: NEGATIVE mg/dL
NITRITE: POSITIVE — AB
PH: 5 (ref 5.0–8.0)
Protein, ur: NEGATIVE mg/dL
SPECIFIC GRAVITY, URINE: 1.018 (ref 1.005–1.030)
WBC, UA: >50 WBC/hpf — ABNORMAL HIGH (ref 0–5)

## 2018-04-30 LAB — I-STAT BETA HCG BLOOD, ED (MC, WL, AP ONLY)

## 2018-04-30 MED ORDER — PHENAZOPYRIDINE HCL 200 MG PO TABS
200.0000 mg | ORAL_TABLET | Freq: Once | ORAL | Status: AC
  Administered 2018-04-30: 200 mg via ORAL
  Filled 2018-04-30: qty 1

## 2018-04-30 MED ORDER — NITROFURANTOIN MONOHYD MACRO 100 MG PO CAPS: 100.0000 mg | ORAL_CAPSULE | Freq: Two times a day (BID) | ORAL | 0 refills | Status: DC

## 2018-04-30 MED ORDER — PYRIDOXINE HCL 100 MG/ML IJ SOLN
100.0000 mg | Freq: Once | INTRAMUSCULAR | Status: DC
  Filled 2018-04-30: qty 1

## 2018-04-30 MED ORDER — NITROFURANTOIN MONOHYD MACRO 100 MG PO CAPS
100.0000 mg | ORAL_CAPSULE | Freq: Once | ORAL | Status: AC
  Administered 2018-04-30: 100 mg via ORAL
  Filled 2018-04-30: qty 1

## 2018-04-30 MED ORDER — PHENAZOPYRIDINE HCL 200 MG PO TABS: 200.0000 mg | ORAL_TABLET | Freq: Three times a day (TID) | ORAL | 0 refills | Status: DC

## 2018-04-30 MED ORDER — PHENAZOPYRIDINE HCL 200 MG PO TABS: 200.0000 mg | ORAL_TABLET | Freq: Three times a day (TID) | ORAL | 0 refills | Status: AC

## 2018-04-30 MED ORDER — NITROFURANTOIN MONOHYD MACRO 100 MG PO CAPS: 100.0000 mg | ORAL_CAPSULE | Freq: Two times a day (BID) | ORAL | 0 refills | Status: AC

## 2018-04-30 NOTE — ED Provider Notes (Signed)
[facility]-EMERGENCY DEPT Provider Note   CSN: [id number] Arrival date & time: 04/30/18  0216     History   Chief Complaint Chief Complaint  Patient presents with  . Urinary Frequency  . Dysuria    HPI Nancy Rocha is a 27 y.o. female.  HPI  27 year old female comes in with chief complaint of urinary frequency and burning with urination.  Patient states that her symptoms started yesterday during the daytime.  She went to the pharmacy and bought over-the-counter Azo and started drinking cranberry juice and felt well during the daytime, however at nighttime her symptoms returned.  She is having pressure-like sensation in the suprapubic region along with urinary frequency and tingling sensation. Patient has no history of STDs, no risk factors for STDs and is involved in a same-sex relationship. She denies any nausea, vomiting, fevers, chills.  She had singular episode of right-sided flank pain which resolved on its own.  At the moment she does not have any pain in the back.  Past Medical History:  Diagnosis Date  . Anxiety   . Anxiety disorder     Patient Active Problem List   Diagnosis Date Noted  . Bartholin gland cyst 07/09/2015  . Anxiety 07/09/2015    Past Surgical History:  Procedure Laterality Date  . DENTAL SURGERY    . WISDOM TOOTH EXTRACTION       OB History    Gravida  0   Para  0   Term  0   Preterm  0   AB  0   Living  0     SAB  0   TAB  0   Ectopic  0   Multiple  0   Live Births               Home Medications    Prior to Admission medications   Medication Sig Start Date End Date Taking? Authorizing Provider  metroNIDAZOLE (FLAGYL) 500 MG tablet Take 1 tablet (500 mg total) by mouth 2 (two) times daily. 09/23/17   Sharyon Cable, CNM  nitrofurantoin, macrocrystal-monohydrate, (MACROBID) 100 MG capsule Take 1 capsule (100 mg total) by mouth 2 (two) times daily. 04/30/18   Derwood Kaplan, MD    phenazopyridine (PYRIDIUM) 200 MG tablet Take 1 tablet (200 mg total) by mouth 3 (three) times daily. 04/30/18   Derwood Kaplan, MD    Family History No family history on file.  Social History Social History   Tobacco Use  . Smoking status: Current Some Day Smoker    Packs/day: 0.25  . Smokeless tobacco: Never Used  . Tobacco comment: smokes hookah  Substance Use Topics  . Alcohol use: Yes    Alcohol/week: 0.0 standard drinks    Comment: occ  . Drug use: No     Allergies   Pollen extract   Review of Systems Review of Systems  Constitutional: Negative for chills and fever.  Gastrointestinal: Negative for nausea and vomiting.  Genitourinary: Positive for dysuria.     Physical Exam Updated Vital Signs BP 130/77 (BP Location: Left Arm)   Pulse 80   Temp 97.9 F (36.6 C) (Oral)   Resp 15   Ht 5\' 2"  (1.575 m)   Wt 65.4 kg   SpO2 100%   BMI 26.36 kg/m   Physical Exam Vitals signs and nursing note reviewed.  Constitutional:      Appearance: She is well-developed.  HENT:     Head: Normocephalic and atraumatic.  Neck:  Musculoskeletal: Normal range of motion and neck supple.  Cardiovascular:     Rate and Rhythm: Normal rate.  Pulmonary:     Effort: Pulmonary effort is normal.  Abdominal:     General: Bowel sounds are normal.  Skin:    General: Skin is warm and dry.  Neurological:     Mental Status: She is alert and oriented to person, place, and time.      ED Treatments / Results  Labs (all labs ordered are listed, but only abnormal results are displayed) Labs Reviewed  URINALYSIS, ROUTINE W REFLEX MICROSCOPIC - Abnormal; Notable for the following components:      Result Value   APPearance HAZY (*)    Hgb urine dipstick MODERATE (*)    Nitrite POSITIVE (*)    Leukocytes, UA MODERATE (*)    WBC, UA >50 (*)    Bacteria, UA MANY (*)    All other components within normal limits  I-STAT BETA HCG BLOOD, ED ([facility], [facility], [facility] ONLY)     EKG None  Radiology No results found.  Procedures Procedures (including critical care time)  Medications Ordered in ED Medications  nitrofurantoin (macrocrystal-monohydrate) (MACROBID) capsule 100 mg (has no administration in time range)  phenazopyridine (PYRIDIUM) tablet 200 mg (has no administration in time range)     Initial Impression / Assessment and Plan / ED Course  I have reviewed the triage vital signs and the nursing notes.  Pertinent labs & imaging results that were available during my care of the patient were reviewed by me and considered in my medical decision making (see chart for details).     27 year old female comes in with chief complaint of dysuria and urinary frequency.  Her UA is nitrite positive and has multiple bacteria and WBCs. Clinically she has UTI.  She does not have risk factors for STDs and is not involved in any high risk sexual behavior.    Final Clinical Impressions(s) / ED Diagnoses   Final diagnoses:  Acute cystitis without hematuria    ED Discharge Orders         Ordered    nitrofurantoin, macrocrystal-monohydrate, (MACROBID) 100 MG capsule  2 times daily     04/30/18 0425    phenazopyridine (PYRIDIUM) 200 MG tablet  3 times daily     04/30/18 0425           Derwood KaplanNanavati, Merrin Mcvicker, MD 04/30/18 [phone]

## 2018-04-30 NOTE — ED Notes (Signed)
Urine culture sent to lab as well by this nurse

## 2018-04-30 NOTE — ED Notes (Signed)
Patient has extra blood in the lab one light green and lavendar

## 2018-04-30 NOTE — ED Triage Notes (Signed)
Pt to Ed with complaints of urinary frequency and pain/burning on urination that started yesterday. Pt states she took AZO pills today from the pharmacy and symptoms still persisted throughout the day. Pt is A&O x4 and Ambulatory.

## 2018-04-30 NOTE — Discharge Instructions (Signed)
We saw you in the ER for pain with urination. We suspect that you have bladder infection - please take the antibiotics. Return to the ER if you start having flank pain or inability to keep meds down. Just so you are aware - pyridium will turn your urine orange.

## 2022-01-03 ENCOUNTER — Inpatient Hospital Stay: Admit: 2022-01-03 | Discharge: 2022-01-03 | Payer: PRIVATE HEALTH INSURANCE | Attending: Emergency Medicine

## 2022-01-03 ENCOUNTER — Ambulatory Visit: Admit: 2022-01-03 | Payer: PRIVATE HEALTH INSURANCE

## 2022-01-03 ENCOUNTER — Encounter: Admit: 2022-01-03 | Payer: PRIVATE HEALTH INSURANCE

## 2022-01-03 DIAGNOSIS — M549 Dorsalgia, unspecified: Secondary | ICD-10-CM

## 2022-01-03 DIAGNOSIS — K219 Gastro-esophageal reflux disease without esophagitis: Secondary | ICD-10-CM

## 2022-01-03 DIAGNOSIS — R0789 Other chest pain: Secondary | ICD-10-CM

## 2022-01-03 LAB — COMPREHENSIVE METABOLIC PANEL
BKR A/G RATIO: 1.4 (ref 1.0–2.2)
BKR ALANINE AMINOTRANSFERASE (ALT): 16 U/L (ref 10–35)
BKR ALBUMIN: 5.1 g/dL — ABNORMAL HIGH (ref 3.6–4.9)
BKR ALKALINE PHOSPHATASE: 56 U/L (ref 9–122)
BKR ANION GAP: 10 (ref 7–17)
BKR ASPARTATE AMINOTRANSFERASE (AST): 24 U/L (ref 10–35)
BKR AST/ALT RATIO: 1.5
BKR BILIRUBIN TOTAL: 0.3 mg/dL (ref <=1.2)
BKR BLOOD UREA NITROGEN: 9 mg/dL (ref 6–20)
BKR BUN / CREAT RATIO: 14.3 (ref 8.0–23.0)
BKR CALCIUM: 9.9 mg/dL (ref 8.8–10.2)
BKR CHLORIDE: 98 mmol/L (ref 98–107)
BKR CO2: 26 mmol/L (ref 20–30)
BKR CREATININE: 0.63 mg/dL (ref 0.40–1.30)
BKR EGFR, CREATININE (CKD-EPI 2021): >60 mL/min/{1.73_m2} (ref >=60)
BKR GLOBULIN: 3.6 g/dL — ABNORMAL HIGH (ref 2.3–3.5)
BKR GLUCOSE: 81 mg/dL (ref 70–100)
BKR POTASSIUM: 3.4 mmol/L (ref 3.3–5.3)
BKR PROTEIN TOTAL: 8.7 g/dL (ref 6.6–8.7)
BKR SODIUM: 134 mmol/L — ABNORMAL LOW (ref 136–144)

## 2022-01-03 LAB — TROPONIN T HIGH SENSITIVITY, 1 HOUR WITH REFLEX (BH GH LMW YH)
BKR TROPONIN T HS 1 HOUR DELTA FROM 0 HOUR: 0 ng/L
BKR TROPONIN T HS 1 HOUR: <6 ng/L

## 2022-01-03 LAB — CBC WITH AUTO DIFFERENTIAL
BKR WAM ABSOLUTE IMMATURE GRANULOCYTES.: 0.02 x 1000/ÂµL (ref 0.00–0.30)
BKR WAM ABSOLUTE LYMPHOCYTE COUNT.: 3.23 x 1000/ÂµL (ref 0.60–3.70)
BKR WAM ABSOLUTE NRBC (2 DEC): 0 x 1000/ÂµL (ref 0.00–1.00)
BKR WAM ANALYZER ANC: 4.27 x 1000/ÂµL (ref 2.00–7.60)
BKR WAM BASOPHIL ABSOLUTE COUNT.: 0.04 x 1000/ÂµL (ref 0.00–1.00)
BKR WAM BASOPHILS: 0.5 % (ref 0.0–1.4)
BKR WAM EOSINOPHIL ABSOLUTE COUNT.: 0.11 x 1000/ÂµL (ref 0.00–1.00)
BKR WAM EOSINOPHILS: 1.3 % (ref 0.0–5.0)
BKR WAM HEMATOCRIT (2 DEC): 40.4 % (ref 35.00–45.00)
BKR WAM HEMOGLOBIN: 12.4 g/dL (ref 11.7–15.5)
BKR WAM IMMATURE GRANULOCYTES: 0.2 % (ref 0.0–1.0)
BKR WAM LYMPHOCYTES: 38.8 % (ref 17.0–50.0)
BKR WAM MCH (PG): 23 pg — ABNORMAL LOW (ref 27.0–33.0)
BKR WAM MCHC: 30.7 g/dL — ABNORMAL LOW (ref 31.0–36.0)
BKR WAM MCV: 74.8 fL — ABNORMAL LOW (ref 80.0–100.0)
BKR WAM MONOCYTE ABSOLUTE COUNT.: 0.65 x 1000/ÂµL (ref 0.00–1.00)
BKR WAM MONOCYTES: 7.8 % (ref 4.0–12.0)
BKR WAM MPV: 9.8 fL (ref 8.0–12.0)
BKR WAM NEUTROPHILS: 51.4 % (ref 39.0–72.0)
BKR WAM NUCLEATED RED BLOOD CELLS: 0 % (ref 0.0–1.0)
BKR WAM PLATELETS: 568 x1000/ÂµL — ABNORMAL HIGH (ref 150–420)
BKR WAM RDW-CV: 19.1 % — ABNORMAL HIGH (ref 11.0–15.0)
BKR WAM RED BLOOD CELL COUNT.: 5.4 M/ÂµL (ref 4.00–6.00)
BKR WAM WHITE BLOOD CELL COUNT: 8.3 x1000/ÂµL (ref 4.0–11.0)

## 2022-01-03 LAB — PROTIME AND INR
BKR INR: 1.01 (ref 0.92–1.08)
BKR PROTHROMBIN TIME: 10.5 s (ref 9.5–12.1)

## 2022-01-03 LAB — URINALYSIS WITH CULTURE REFLEX      (BH LMW YH)
BKR BILIRUBIN, UA: NEGATIVE
BKR BLOOD, UA: NEGATIVE
BKR GLUCOSE, UA: NEGATIVE
BKR KETONES, UA: NEGATIVE
BKR LEUKOCYTE ESTERASE, UA: NEGATIVE
BKR NITRITE, UA: NEGATIVE
BKR PH, UA: 5.5 (ref 5.5–7.5)
BKR SPECIFIC GRAVITY, UA: 1.031 — ABNORMAL HIGH (ref 1.005–1.030)
BKR UROBILINOGEN, UA (MG/DL): <2 mg/dL (ref <=2.0)

## 2022-01-03 LAB — URINE MICROSCOPIC     (BH GH LMW YH)
BKR RBC/HPF INSTRUMENT: 5 /HPF — ABNORMAL HIGH (ref 0–2)
BKR URINE SQUAMOUS EPITHELIAL CELLS, UA (NUMERIC): 8 /HPF — ABNORMAL HIGH (ref 0–5)
BKR WBC/HPF INSTRUMENT: 1 /HPF (ref 0–5)

## 2022-01-03 LAB — TROPONIN T HIGH SENSITIVITY, 0 HOUR BASELINE WITH REFLEX (BH GH LMW YH): BKR TROPONIN T HS 0 HOUR BASELINE: <6 ng/L

## 2022-01-03 LAB — UA REFLEX CULTURE

## 2022-01-03 LAB — MAGNESIUM: BKR MAGNESIUM: 1.8 mg/dL (ref 1.7–2.4)

## 2022-01-03 LAB — D-DIMER, QUANTITATIVE: BKR D-DIMER: <0.19 mg{FEU}/L (ref <=0.50)

## 2022-01-03 MED ORDER — FAMOTIDINE 4 MG/ML IN 0.9% SODIUM CHLORIDE (ADULT)
Freq: Once | INTRAVENOUS | Status: CP
Start: 2022-01-03 — End: ?
  Administered 2022-01-03: 11:00:00 5.000 mL via INTRAVENOUS

## 2022-01-03 MED ORDER — KETOROLAC 30 MG/ML (1 ML) INJECTION SOLUTION
30 mg/mL (1 mL) | Freq: Once | INTRAVENOUS | Status: CP
Start: 2022-01-03 — End: ?
  Administered 2022-01-03: 11:00:00 30 mL via INTRAVENOUS

## 2022-01-03 MED ORDER — ALUMINUM-MAG HYDROXIDE-SIMETHICONE 200 MG-200 MG-20 MG/5 ML ORAL SUSP
200-200-20 mg/5 mL | Freq: Once | ORAL | Status: CP
Start: 2022-01-03 — End: ?
  Administered 2022-01-03: 11:00:00 200-200-20 mL via ORAL

## 2022-01-03 NOTE — Discharge Instructions
Thank you for visiting [place] today, I hope you feel better soonPlease keep your appointment with your new primary care provider at the end of the month, while you are there for your physical exam please let them know about today's workup.If you develop new or worsening chest pain, chest pain that radiates down the left arm with associated numbness and weakness, difficulty breathing, fever, nausea with vomiting, please return to the emergency department

## 2022-01-03 NOTE — ED Notes
7:10 AM Provider to see pt.

## 2022-01-03 NOTE — ED Notes
5:54 AM pt reports sudden onset of left sided chest pain which radiates through to her left upper back at 0430 this am. Pain has persisted and is aggravated by positional changes and deep breathing. Denies trauma, no associated SOB, cough, fever or dizziness reported. Awaiting ed provider's evaluation and further Luberta Robertson, RN

## 2022-01-08 NOTE — ED Provider Notes
Chief Complaint Patient presents with ? Chest Pain   Left sided chest pain rad to back which woke her up approx 4:30 am - , no sob but pain inc with deep breath, no nausea - also experiemcing burning seansation and sharp ache left chest HPI/PE:31 year old female presents to the emergency department today with concerns for left sided chest pain. The patient reports the sharp pain woke her up from sleep around 4:30 AM and has been persistent since. The patient states the sharp pain is a constant 8/10 located on the left side radiating towards her back and is worse with deep inspiration. States she has never experienced this before and has had no recent change in activity. She states she feels better walking around staying occupied.  Patient denies any cardiac history, states that she does not, hormonal birth control, and has not had any recent long distance travel. Denies any fever, chills, cough, congestion, SOB, palpitations, left arm pain, abdominal pain, n/v/d, constipation, weakness, dizziness.MDM:Ddx: acid reflux, msk pain, ACS, anxietyAssessment and plan:Patient well appearing on exam, lying comfortably on stretcher in no acute distressCardiac rate and rhythm regular, lungs are clear to auscultation bilaterally, abdomen soft nontenderChest wall nontender to palpationPlan for labs, trops, CXR, pepcid, maalox and toradolChest XR: -->Impression:No focal consolidation. No pneumothorax. [facility]: Routine Report initiated by: Shelda Pal, MD Labs unremarkable, initial troponin <6, 1 hour troponin <6D-dimer pendingPatient reports her acid reflux has resolved after receiving Pepcid and MaaloxD-dimer negativePatient reports her chest pain has significantly decreased, and she was feeling well at this timeNegative cardiac workup, patient responded well to receiving Maalox and Pepcid, indicating she most likely was experiencing acid reflux related chest painPatient reports she does have acid reflux medications at home.  Return precautions were discussed with the patient.  She vocalizes that she was seeing a new primary care provider and has a physical scheduled for 8/28 in when she will follow up with them during that visit.  Information about chest pain not caused by cardiac conditions and women provided to patient.  Patient understands and agrees with the plan.  Stable for discharge at this time.Carlye Grippe PA-C  Physical ExamED Triage Vitals [01/03/22 0522]BP: 126/86Pulse: 78Pulse from  O2 sat: n/aResp: 17Temp: 97 ?F (36.1 ?C)Temp src: TemporalSpO2: 100 % BP 108/73  - Pulse 81  - Temp 97.3 ?F (36.3 ?C) (Oral)  - Resp 16  - Wt 65.5 kg (144 lb 6.4 oz)  - LMP 12/27/2021 (Approximate)  - SpO2 100% Physical ExamConstitutional:     General: She is not in acute distress.   Appearance: She is well-developed and normal weight. HENT:    Head: Normocephalic and atraumatic. Eyes:    Pupils: Pupils are equal, round, and reactive to light. Cardiovascular:    Rate and Rhythm: Normal rate and regular rhythm.    Heart sounds: Normal heart sounds. Pulmonary:    Effort: Pulmonary effort is normal. No accessory muscle usage or respiratory distress.    Breath sounds: Normal breath sounds. No wheezing or rales. Chest:    Chest wall: No mass, deformity, tenderness or crepitus. Abdominal:    General: Bowel sounds are normal.    Palpations: Abdomen is soft. Musculoskeletal:    Cervical back: Normal range of motion and neck supple. Lymphadenopathy:    Cervical: No cervical adenopathy. Skin:   Capillary Refill: Capillary refill takes less than 2 seconds. Neurological:    General: No focal deficit present.    Mental Status: She is alert and oriented to  person, place, and time.  ProceduresAttestation/Critical CareClinical Impressions as of 01/07/22 2345 Chest pain, unspecified type Gastroesophageal reflux disease, unspecified whether esophagitis present  ---I reviewed the history and physical examination and discussed the management with  PA Carlye Grippe. I agree with the findings and plan of care, with the following noted exceptions and additions. 31 y.o. female with past medical history of GERD, presents to the ED with left-sided chest pain that woke her up from sleep.  Reports that it is worse with deep inspiration.  Denies any associated shortness of breath nausea or diaphoresis or palpitations.  Denies any cardiac history.  Denies any family history of sudden cardiac death.  States she has associated feelings of heartburn as well but states she does not normally get chest pain associated with it.  Reports a recent 2 hour flight to the [facility] but otherwise denies PE/DVT risk factors. No LE edema, erythema, distended vasculature, posterior popliteal tenderness, or other clinical evidence of DVT b/l.DDX: PE, pna, GERD, MSK pain, lower suspicion for ACSPlan for Dimer, labs, trop, CXR, EKGGiven GI cocktail with some improvementCourse: Doing well on reassessment, symptoms improvedManagement decisions made amidst ongoing COVID-19 pandemic and take that reality into account.Koren Shiver, MDED DispositionDischarge Carlye Grippe, PA08/22/23 0631 Carlye Grippe, PA08/22/23 1610 Louis Matte, MD08/22/23 [phone]
